# Patient Record
Sex: Female | Born: 1965 | Race: Black or African American | Hispanic: No | Marital: Married | Smoking: Former smoker
Health system: Southern US, Community
[De-identification: ages and names within clinical notes are randomized; demographics above are authoritative.]

## PROBLEM LIST (undated history)

## (undated) DIAGNOSIS — Z86010 Personal history of colonic polyps: Secondary | ICD-10-CM

## (undated) DIAGNOSIS — I10 Essential (primary) hypertension: Secondary | ICD-10-CM

## (undated) DIAGNOSIS — J309 Allergic rhinitis, unspecified: Secondary | ICD-10-CM

## (undated) DIAGNOSIS — M94261 Chondromalacia, right knee: Secondary | ICD-10-CM

## (undated) DIAGNOSIS — F321 Major depressive disorder, single episode, moderate: Secondary | ICD-10-CM

## (undated) DIAGNOSIS — K8020 Calculus of gallbladder without cholecystitis without obstruction: Secondary | ICD-10-CM

## (undated) DIAGNOSIS — F419 Anxiety disorder, unspecified: Secondary | ICD-10-CM

## (undated) HISTORY — DX: Allergic rhinitis, unspecified: J30.9

## (undated) HISTORY — DX: Chondromalacia, right knee: M94.261

## (undated) HISTORY — DX: Personal history of colonic polyps: Z86.010

## (undated) HISTORY — DX: Major depressive disorder, single episode, moderate: F32.1

## (undated) HISTORY — DX: Calculus of gallbladder without cholecystitis without obstruction: K80.20

---

## 2010-05-22 ENCOUNTER — Emergency Department (HOSPITAL_BASED_OUTPATIENT_CLINIC_OR_DEPARTMENT_OTHER): Admission: EM | Admit: 2010-05-22 | Discharge: 2010-05-22 | Payer: Self-pay | Admitting: Emergency Medicine

## 2010-08-17 DIAGNOSIS — J309 Allergic rhinitis, unspecified: Secondary | ICD-10-CM | POA: Insufficient documentation

## 2010-08-17 DIAGNOSIS — E669 Obesity, unspecified: Secondary | ICD-10-CM | POA: Insufficient documentation

## 2010-08-17 HISTORY — DX: Allergic rhinitis, unspecified: J30.9

## 2010-11-18 ENCOUNTER — Inpatient Hospital Stay (HOSPITAL_COMMUNITY)
Admission: EM | Admit: 2010-11-18 | Discharge: 2010-11-21 | Payer: Self-pay | Source: Home / Self Care | Admitting: Emergency Medicine

## 2010-11-18 ENCOUNTER — Ambulatory Visit: Payer: Self-pay | Admitting: Infectious Diseases

## 2010-12-05 ENCOUNTER — Encounter
Admission: RE | Admit: 2010-12-05 | Discharge: 2010-12-05 | Payer: Self-pay | Source: Home / Self Care | Attending: Family Medicine | Admitting: Family Medicine

## 2011-03-06 LAB — BASIC METABOLIC PANEL
BUN: 2 mg/dL — ABNORMAL LOW (ref 6–23)
BUN: 3 mg/dL — ABNORMAL LOW (ref 6–23)
BUN: 5 mg/dL — ABNORMAL LOW (ref 6–23)
CO2: 24 mEq/L (ref 19–32)
CO2: 26 mEq/L (ref 19–32)
CO2: 27 mEq/L (ref 19–32)
Calcium: 8.4 mg/dL (ref 8.4–10.5)
Calcium: 8.5 mg/dL (ref 8.4–10.5)
Calcium: 8.9 mg/dL (ref 8.4–10.5)
Chloride: 105 mEq/L (ref 96–112)
Chloride: 106 mEq/L (ref 96–112)
Chloride: 106 mEq/L (ref 96–112)
Creatinine, Ser: 0.71 mg/dL (ref 0.4–1.2)
Creatinine, Ser: 0.79 mg/dL (ref 0.4–1.2)
Creatinine, Ser: 0.94 mg/dL (ref 0.4–1.2)
GFR calc Af Amer: >60 mL/min (ref >60)
GFR calc Af Amer: >60 mL/min (ref >60)
GFR calc Af Amer: >60 mL/min (ref >60)
GFR calc non Af Amer: >60 mL/min (ref >60)
GFR calc non Af Amer: >60 mL/min (ref >60)
GFR calc non Af Amer: >60 mL/min (ref >60)
Glucose, Bld: 103 mg/dL — ABNORMAL HIGH (ref 70–99)
Glucose, Bld: 104 mg/dL — ABNORMAL HIGH (ref 70–99)
Glucose, Bld: 97 mg/dL (ref 70–99)
Potassium: 3.8 mEq/L (ref 3.5–5.1)
Potassium: 4 mEq/L (ref 3.5–5.1)
Potassium: 4.1 mEq/L (ref 3.5–5.1)
Sodium: 137 mEq/L (ref 135–145)
Sodium: 138 mEq/L (ref 135–145)
Sodium: 140 mEq/L (ref 135–145)

## 2011-03-06 LAB — HEMOGLOBIN A1C
Hgb A1c MFr Bld: 5.8 % — ABNORMAL HIGH (ref <5.7)
Mean Plasma Glucose: 120 mg/dL — ABNORMAL HIGH (ref <117)

## 2011-03-06 LAB — CBC
HCT: 31.6 % — ABNORMAL LOW (ref 36.0–46.0)
HCT: 32.4 % — ABNORMAL LOW (ref 36.0–46.0)
HCT: 33 % — ABNORMAL LOW (ref 36.0–46.0)
HCT: 40.8 % (ref 36.0–46.0)
Hemoglobin: 10.8 g/dL — ABNORMAL LOW (ref 12.0–15.0)
Hemoglobin: 11 g/dL — ABNORMAL LOW (ref 12.0–15.0)
Hemoglobin: 11.1 g/dL — ABNORMAL LOW (ref 12.0–15.0)
Hemoglobin: 14.1 g/dL (ref 12.0–15.0)
MCH: 29.4 pg (ref 26.0–34.0)
MCH: 30.7 pg (ref 26.0–34.0)
MCH: 30.9 pg (ref 26.0–34.0)
MCH: 31.1 pg (ref 26.0–34.0)
MCHC: 33.3 g/dL (ref 30.0–36.0)
MCHC: 34.2 g/dL (ref 30.0–36.0)
MCHC: 34.3 g/dL (ref 30.0–36.0)
MCHC: 34.5 g/dL (ref 30.0–36.0)
MCV: 88.2 fL (ref 78.0–100.0)
MCV: 89.8 fL (ref 78.0–100.0)
MCV: 89.9 fL (ref 78.0–100.0)
MCV: 90 fL (ref 78.0–100.0)
Platelets: 184 10*3/uL (ref 150–400)
Platelets: 190 10*3/uL (ref 150–400)
Platelets: 197 10*3/uL (ref 150–400)
Platelets: ADEQUATE 10*3/uL (ref 150–400)
RBC: 3.51 MIL/uL — ABNORMAL LOW (ref 3.87–5.11)
RBC: 3.61 MIL/uL — ABNORMAL LOW (ref 3.87–5.11)
RBC: 3.74 MIL/uL — ABNORMAL LOW (ref 3.87–5.11)
RBC: 4.53 MIL/uL (ref 3.87–5.11)
RDW: 12.5 % (ref 11.5–15.5)
RDW: 12.6 % (ref 11.5–15.5)
RDW: 12.9 % (ref 11.5–15.5)
RDW: 13.2 % (ref 11.5–15.5)
WBC: 12.7 10*3/uL — ABNORMAL HIGH (ref 4.0–10.5)
WBC: 5.4 10*3/uL (ref 4.0–10.5)
WBC: 5.7 10*3/uL (ref 4.0–10.5)
WBC: 7.7 10*3/uL (ref 4.0–10.5)

## 2011-03-06 LAB — POCT I-STAT, CHEM 8
BUN: <3 mg/dL — ABNORMAL LOW (ref 6–23)
Calcium, Ion: 1.12 mmol/L (ref 1.12–1.32)
Chloride: 103 mEq/L (ref 96–112)
Creatinine, Ser: 1 mg/dL (ref 0.4–1.2)
Glucose, Bld: 101 mg/dL — ABNORMAL HIGH (ref 70–99)
HCT: 45 % (ref 36.0–46.0)
Hemoglobin: 15.3 g/dL — ABNORMAL HIGH (ref 12.0–15.0)
Potassium: 4.8 mEq/L (ref 3.5–5.1)
Sodium: 138 mEq/L (ref 135–145)
TCO2: 28 mmol/L (ref 0–100)

## 2011-03-06 LAB — CULTURE, BLOOD (ROUTINE X 2)
Culture  Setup Time: 201111280009
Culture  Setup Time: 201111280009
Culture: NO GROWTH
Culture: NO GROWTH

## 2011-03-06 LAB — DIFFERENTIAL
Basophils Absolute: 0 10*3/uL (ref 0.0–0.1)
Basophils Relative: 0 % (ref 0–1)
Eosinophils Absolute: 0 10*3/uL (ref 0.0–0.7)
Eosinophils Relative: 0 % (ref 0–5)
Lymphocytes Relative: 22 % (ref 12–46)
Lymphs Abs: 2.8 10*3/uL (ref 0.7–4.0)
Monocytes Absolute: 0.8 10*3/uL (ref 0.1–1.0)
Monocytes Relative: 6 % (ref 3–12)
Neutro Abs: 9.1 10*3/uL — ABNORMAL HIGH (ref 1.7–7.7)
Neutrophils Relative %: 72 % (ref 43–77)

## 2011-03-06 LAB — HIV-1 RNA ULTRAQUANT REFLEX TO GENTYP+
HIV 1 RNA Quant: <20 copies/mL (ref <20)
HIV-1 RNA Quant, Log: <1.3 {Log} (ref <1.30)

## 2012-02-13 DIAGNOSIS — L659 Nonscarring hair loss, unspecified: Secondary | ICD-10-CM | POA: Insufficient documentation

## 2012-02-14 DIAGNOSIS — L089 Local infection of the skin and subcutaneous tissue, unspecified: Secondary | ICD-10-CM | POA: Insufficient documentation

## 2013-08-18 ENCOUNTER — Emergency Department (HOSPITAL_COMMUNITY): Payer: Self-pay

## 2013-08-18 ENCOUNTER — Emergency Department (HOSPITAL_COMMUNITY)
Admission: EM | Admit: 2013-08-18 | Discharge: 2013-08-18 | Disposition: A | Discharge status: Home / Self Care | Payer: Self-pay | Attending: Emergency Medicine | Admitting: Emergency Medicine

## 2013-08-18 ENCOUNTER — Encounter (HOSPITAL_COMMUNITY): Payer: Self-pay | Admitting: Emergency Medicine

## 2013-08-18 DIAGNOSIS — R51 Headache: Secondary | ICD-10-CM | POA: Insufficient documentation

## 2013-08-18 DIAGNOSIS — R000 Tachycardia, unspecified: Secondary | ICD-10-CM | POA: Insufficient documentation

## 2013-08-18 DIAGNOSIS — R05 Cough: Secondary | ICD-10-CM

## 2013-08-18 DIAGNOSIS — R519 Headache, unspecified: Secondary | ICD-10-CM

## 2013-08-18 DIAGNOSIS — R071 Chest pain on breathing: Secondary | ICD-10-CM | POA: Insufficient documentation

## 2013-08-18 DIAGNOSIS — R509 Fever, unspecified: Secondary | ICD-10-CM | POA: Insufficient documentation

## 2013-08-18 DIAGNOSIS — R0602 Shortness of breath: Secondary | ICD-10-CM | POA: Insufficient documentation

## 2013-08-18 DIAGNOSIS — R059 Cough, unspecified: Secondary | ICD-10-CM | POA: Insufficient documentation

## 2013-08-18 DIAGNOSIS — M791 Myalgia, unspecified site: Secondary | ICD-10-CM

## 2013-08-18 DIAGNOSIS — IMO0001 Reserved for inherently not codable concepts without codable children: Secondary | ICD-10-CM | POA: Insufficient documentation

## 2013-08-18 LAB — CBC WITH DIFFERENTIAL/PLATELET
Basophils Absolute: 0 10*3/uL (ref 0.0–0.1)
Basophils Relative: 0 % (ref 0–1)
Eosinophils Absolute: 0 10*3/uL (ref 0.0–0.7)
Eosinophils Relative: 0 % (ref 0–5)
HCT: 40.5 % (ref 36.0–46.0)
Hemoglobin: 13.5 g/dL (ref 12.0–15.0)
Lymphocytes Relative: 13 % (ref 12–46)
Lymphs Abs: 1.1 10*3/uL (ref 0.7–4.0)
MCH: 29.7 pg (ref 26.0–34.0)
MCHC: 33.3 g/dL (ref 30.0–36.0)
MCV: 89 fL (ref 78.0–100.0)
Monocytes Absolute: 0.6 10*3/uL (ref 0.1–1.0)
Monocytes Relative: 7 % (ref 3–12)
Neutro Abs: 7 10*3/uL (ref 1.7–7.7)
Neutrophils Relative %: 80 % — ABNORMAL HIGH (ref 43–77)
Platelets: 172 10*3/uL (ref 150–400)
RBC: 4.55 MIL/uL (ref 3.87–5.11)
RDW: 12.4 % (ref 11.5–15.5)
WBC: 8.7 10*3/uL (ref 4.0–10.5)

## 2013-08-18 LAB — URINALYSIS, ROUTINE W REFLEX MICROSCOPIC
Bilirubin Urine: NEGATIVE
Glucose, UA: NEGATIVE mg/dL
Hgb urine dipstick: NEGATIVE
Ketones, ur: NEGATIVE mg/dL
Leukocytes, UA: NEGATIVE
Nitrite: NEGATIVE
Protein, ur: NEGATIVE mg/dL
Specific Gravity, Urine: 1.024 (ref 1.005–1.030)
Urobilinogen, UA: 1 mg/dL (ref 0.0–1.0)
pH: 8 (ref 5.0–8.0)

## 2013-08-18 LAB — BASIC METABOLIC PANEL
BUN: 5 mg/dL — ABNORMAL LOW (ref 6–23)
CO2: 26 mEq/L (ref 19–32)
Calcium: 9.9 mg/dL (ref 8.4–10.5)
Chloride: 98 mEq/L (ref 96–112)
Creatinine, Ser: 0.8 mg/dL (ref 0.50–1.10)
GFR calc Af Amer: >90 mL/min (ref >90)
GFR calc non Af Amer: 86 mL/min — ABNORMAL LOW (ref >90)
Glucose, Bld: 120 mg/dL — ABNORMAL HIGH (ref 70–99)
Potassium: 3.8 mEq/L (ref 3.5–5.1)
Sodium: 134 mEq/L — ABNORMAL LOW (ref 135–145)

## 2013-08-18 LAB — CG4 I-STAT (LACTIC ACID): Lactic Acid, Venous: 0.64 mmol/L (ref 0.5–2.2)

## 2013-08-18 LAB — ACETAMINOPHEN LEVEL: Acetaminophen (Tylenol), Serum: <15 ug/mL (ref 10–30)

## 2013-08-18 MED ORDER — ACETAMINOPHEN 325 MG PO TABS: 650.0000 mg | ORAL_TABLET | Freq: Once | ORAL | Status: DC

## 2013-08-18 MED ORDER — MORPHINE SULFATE 4 MG/ML IJ SOLN
4.0000 mg | Freq: Once | INTRAMUSCULAR | Status: AC
  Administered 2013-08-18: 4 mg via INTRAVENOUS
  Filled 2013-08-18: qty 1

## 2013-08-18 MED ORDER — OXYCODONE-ACETAMINOPHEN 5-325 MG PO TABS: ORAL_TABLET | ORAL | Status: DC

## 2013-08-18 MED ORDER — SODIUM CHLORIDE 0.9 % IV BOLUS (SEPSIS)
1000.0000 mL | Freq: Once | INTRAVENOUS | Status: AC
  Administered 2013-08-18: 1000 mL via INTRAVENOUS

## 2013-08-18 MED ORDER — DOXYCYCLINE HYCLATE 100 MG PO TABS
100.0000 mg | ORAL_TABLET | Freq: Once | ORAL | Status: AC
  Administered 2013-08-18: 100 mg via ORAL
  Filled 2013-08-18: qty 1

## 2013-08-18 MED ORDER — DOXYCYCLINE HYCLATE 100 MG PO CAPS: 100.0000 mg | ORAL_CAPSULE | Freq: Two times a day (BID) | ORAL | Status: DC

## 2013-08-18 MED ORDER — IBUPROFEN 800 MG PO TABS
800.0000 mg | ORAL_TABLET | Freq: Once | ORAL | Status: AC
  Administered 2013-08-18: 800 mg via ORAL
  Filled 2013-08-18: qty 1

## 2013-08-18 MED ORDER — ONDANSETRON HCL 4 MG/2ML IJ SOLN
4.0000 mg | Freq: Once | INTRAMUSCULAR | Status: AC
  Administered 2013-08-18: 4 mg via INTRAVENOUS
  Filled 2013-08-18: qty 2

## 2013-08-18 NOTE — ED Notes (Signed)
Pt states has had fever ranging from 102 to 103.9 for the past 3 days, unrelieved by medication. States "head feels like it's about to blow, and my body hurts all over. Nausea and diarrhea, no vomiting. Unable to eat anything x 2 days.

## 2013-08-18 NOTE — ED Provider Notes (Signed)
CSN: [id number]     Arrival date & time 08/18/13  1948 History   First MD Initiated Contact with Patient 08/18/13 2001     Chief Complaint  Patient presents with  . Fever   (Consider location/radiation/quality/duration/timing/severity/associated sxs/prior Treatment) HPI  Deborah Stevens is a 47 y.o. female is otherwise healthy complaining of fever up to 103.9 starting 3 days ago. Patient has been taking 1000 mg of acetaminophen every 4 hours and cannot defervesce. Associated symptoms at onset were myalgia and headache. Patient also developed a productive cough 2 days ago. She endorses a shortness of breath and pleuritic chest pain. Patient denies abdominal pain, nausea vomiting, recent travel, rash, and she endorses a mild cervicalgia and has a sick contact: Her son had pneumonia 2 weeks ago.   History reviewed. No pertinent past medical history. History reviewed. No pertinent past surgical history. History reviewed. No pertinent family history. History  Substance Use Topics  . Smoking status: Never Smoker   . Smokeless tobacco: Not on file  . Alcohol Use: Yes     Comment: occasional   OB History   Grav Para Term Preterm Abortions TAB SAB Ect Mult Living                 Review of Systems  10 systems reviewed and found to be negative, except as noted in the HPI   Allergies  Review of patient's allergies indicates no known allergies.  Home Medications  No current outpatient prescriptions on file. BP 174/104  Pulse 137  Temp(Src) 103.3 F (39.6 C) (Oral)  Resp 18  SpO2 97%  LMP 08/04/2013 Physical Exam  Nursing note and vitals reviewed. Constitutional: She is oriented to person, place, and time. She appears well-developed and well-nourished. No distress.  HENT:  Head: Normocephalic.  Eyes: Conjunctivae and EOM are normal.  Cardiovascular: Normal rate.   Pulmonary/Chest: Effort normal. No stridor.  Musculoskeletal: Normal range of motion.  Neurological: She is alert and  oriented to person, place, and time.  Psychiatric: She has a normal mood and affect.    ED Course  Procedures (including critical care time) Labs Review Labs Reviewed  URINALYSIS, ROUTINE W REFLEX MICROSCOPIC - Abnormal; Notable for the following:    APPearance CLOUDY (*)    All other components within normal limits  CBC WITH DIFFERENTIAL - Abnormal; Notable for the following:    Neutrophils Relative % 80 (*)    All other components within normal limits  BASIC METABOLIC PANEL - Abnormal; Notable for the following:    Sodium 134 (*)    Glucose, Bld 120 (*)    BUN 5 (*)    GFR calc non Af Amer 86 (*)    All other components within normal limits  CULTURE, BLOOD (ROUTINE X 2)  CULTURE, BLOOD (ROUTINE X 2)  URINE CULTURE  ACETAMINOPHEN LEVEL  CG4 I-STAT (LACTIC ACID)   Imaging Review Dg Chest 2 View  08/18/2013   *RADIOLOGY REPORT*  Clinical Data: Shortness of breath, fever, cough, nausea and vomiting.  CHEST - 2 VIEW  Comparison: None.  Findings: There is suggestion of mild and diffuse bronchial thickening in both lungs which may be consistent with acute bronchitis.  No pulmonary consolidation, edema or pleural fluid is identified.  The heart size and mediastinal contours are within normal limits.  Bony thorax is unremarkable.  IMPRESSION: Suggestion of bronchial thickening which may be consistent with acute bronchitis.   Original Report Authenticated By: Irish Lack, M.D.    MDM  1. Fever   2. Cough   3. Tachycardia   4. Myalgia   5. Headache     Filed Vitals:   08/18/13 1956  BP: 174/104  Pulse: 137  Temp: 103.3 F (39.6 C)  TempSrc: Oral  Resp: 18  SpO2: 97%     Deborah Stevens is a 47 y.o. female is otherwise healthy complaining of fever for 3 days associated with myalgia, productive cough and headache. I doubt meningitis as neck exam is reassuring with no meningeal signs. Urinalysis shows no signs of infection, patient has no white count and no left shift. She is  not dehydrated clinically. Acetaminophen level ordered as patient has been taking 1000 mg of Tylenol every 4 hours to control the fever with out relief. However are Tylenol level is undetectable. Patient does meet serve criteria because of her fever and pulse however lactic acid is negative. Chest x-ray does not reveal an infiltrate however I feel this is the most likely cause of her fever. I will cover her for a community acquired pneumonia and also a [facility] spotted fever with doxycycline. Blood cultures and urine cultures are ordered.   This is a shared visit with the attending physician who personally evaluated the patient and agrees with the care plan.    Medications  sodium chloride 0.9 % bolus 1,000 mL (not administered)  ondansetron (ZOFRAN) injection 4 mg (not administered)    Pt is hemodynamically stable, appropriate for, and amenable to discharge at this time. Pt verbalized understanding and agrees with care plan. All questions answered. Outpatient follow-up and specific return precautions discussed.    Discharge Medication List as of 08/18/2013 11:10 PM    START taking these medications   Details  doxycycline (VIBRAMYCIN) 100 MG capsule Take 1 capsule (100 mg total) by mouth 2 (two) times daily., Starting 08/18/2013, Until Discontinued, Print    oxyCODONE-acetaminophen (PERCOCET/ROXICET) 5-325 MG per tablet 1 to 2 tabs PO q6hrs  PRN for pain, Print        Note: Portions of this report may have been transcribed using voice recognition software. Every effort was made to ensure accuracy; however, inadvertent computerized transcription errors may be present      Wynetta Emery, PA-C 08/19/13 0107

## 2013-08-19 ENCOUNTER — Emergency Department (HOSPITAL_COMMUNITY)
Admission: EM | Admit: 2013-08-19 | Discharge: 2013-08-19 | Disposition: A | Discharge status: Home / Self Care | Payer: Self-pay | Attending: Emergency Medicine | Admitting: Emergency Medicine

## 2013-08-19 ENCOUNTER — Encounter (HOSPITAL_COMMUNITY): Payer: Self-pay

## 2013-08-19 DIAGNOSIS — IMO0001 Reserved for inherently not codable concepts without codable children: Secondary | ICD-10-CM | POA: Insufficient documentation

## 2013-08-19 DIAGNOSIS — R52 Pain, unspecified: Secondary | ICD-10-CM | POA: Insufficient documentation

## 2013-08-19 DIAGNOSIS — J40 Bronchitis, not specified as acute or chronic: Secondary | ICD-10-CM | POA: Insufficient documentation

## 2013-08-19 DIAGNOSIS — R509 Fever, unspecified: Secondary | ICD-10-CM | POA: Insufficient documentation

## 2013-08-19 DIAGNOSIS — Z79899 Other long term (current) drug therapy: Secondary | ICD-10-CM | POA: Insufficient documentation

## 2013-08-19 LAB — CBC WITH DIFFERENTIAL/PLATELET
Basophils Absolute: 0 10*3/uL (ref 0.0–0.1)
Basophils Relative: 0 % (ref 0–1)
Eosinophils Absolute: 0 10*3/uL (ref 0.0–0.7)
Eosinophils Relative: 0 % (ref 0–5)
HCT: 37.7 % (ref 36.0–46.0)
Hemoglobin: 12.5 g/dL (ref 12.0–15.0)
Lymphocytes Relative: 17 % (ref 12–46)
Lymphs Abs: 1.3 10*3/uL (ref 0.7–4.0)
MCH: 29.8 pg (ref 26.0–34.0)
MCHC: 33.2 g/dL (ref 30.0–36.0)
MCV: 89.8 fL (ref 78.0–100.0)
Monocytes Absolute: 0.5 10*3/uL (ref 0.1–1.0)
Monocytes Relative: 7 % (ref 3–12)
Neutro Abs: 5.7 10*3/uL (ref 1.7–7.7)
Neutrophils Relative %: 76 % (ref 43–77)
Platelets: 165 10*3/uL (ref 150–400)
RBC: 4.2 MIL/uL (ref 3.87–5.11)
RDW: 12.6 % (ref 11.5–15.5)
WBC: 7.6 10*3/uL (ref 4.0–10.5)

## 2013-08-19 MED ORDER — ONDANSETRON HCL 4 MG/2ML IJ SOLN
4.0000 mg | Freq: Once | INTRAMUSCULAR | Status: AC
  Administered 2013-08-19: 4 mg via INTRAVENOUS
  Filled 2013-08-19: qty 2

## 2013-08-19 MED ORDER — HYDROMORPHONE HCL PF 1 MG/ML IJ SOLN
0.5000 mg | Freq: Once | INTRAMUSCULAR | Status: AC
  Administered 2013-08-19: 0.5 mg via INTRAVENOUS
  Filled 2013-08-19: qty 1

## 2013-08-19 MED ORDER — KETOROLAC TROMETHAMINE 30 MG/ML IJ SOLN
30.0000 mg | Freq: Once | INTRAMUSCULAR | Status: AC
  Administered 2013-08-19: 30 mg via INTRAVENOUS
  Filled 2013-08-19: qty 1

## 2013-08-19 MED ORDER — SODIUM CHLORIDE 0.9 % IV BOLUS (SEPSIS)
500.0000 mL | Freq: Once | INTRAVENOUS | Status: AC
  Administered 2013-08-19: 500 mL via INTRAVENOUS

## 2013-08-19 MED ORDER — ACETAMINOPHEN 325 MG PO TABS
ORAL_TABLET | ORAL | Status: AC
  Administered 2013-08-19: 650 mg
  Filled 2013-08-19: qty 2

## 2013-08-19 NOTE — ED Notes (Signed)
Pt states she was on her way to get her rx filled and she had near syncope episode. Pt was told to come back to ED if her fever came back. States she is starting to develop headache

## 2013-08-19 NOTE — Discharge Instructions (Signed)
Fever  °Fever is a higher-than-normal body temperature. A normal temperature varies with: °· Age. °· How it is measured (mouth, underarm, rectal, or ear). °· Time of day. °In an adult, an oral temperature around 98.6° Fahrenheit (F) or 37° Celsius (C) is considered normal. A rise in temperature of about 1.8° F or 1° C is generally considered a fever (100.4° F or 38° C). In an infant age 47 days or less, a rectal temperature of 100.4° F (38° C) generally is regarded as fever. Fever is not a disease but can be a symptom of illness. °CAUSES  °· Fever is most commonly caused by infection. °· Some non-infectious problems can cause fever. For example: °· Some arthritis problems. °· Problems with the thyroid or adrenal glands. °· Immune system problems. °· Some kinds of cancer. °· A reaction to certain medicines. °· Occasionally, the source of a fever cannot be determined. This is sometimes called a "Fever of Unknown Origin" (FUO). °· Some situations may lead to a temporary rise in body temperature that may go away on its own. Examples are: °· Childbirth. °· Surgery. °· Some situations may cause a rise in body temperature but these are not considered "true fever". Examples are: °· Intense exercise. °· Dehydration. °· Exposure to high outside or room temperatures. °SYMPTOMS  °· Feeling warm or hot. °· Fatigue or feeling exhausted. °· Aching all over. °· Chills. °· Shivering. °· Sweats. °DIAGNOSIS  °A fever can be suspected by your caregiver feeling that your skin is unusually warm. The fever is confirmed by taking a temperature with a thermometer. Temperatures can be taken different ways. Some methods are accurate and some are not: °With adults, adolescents, and children:  °· An oral temperature is used most commonly. °· An ear thermometer will only be accurate if it is positioned as recommended by the manufacturer. °· Under the arm temperatures are not accurate and not recommended. °· Most electronic thermometers are fast  and accurate. °Infants and Toddlers: °· Rectal temperatures are recommended and most accurate. °· Ear temperatures are not accurate in this age group and are not recommended. °· Skin thermometers are not accurate. °RISKS AND COMPLICATIONS  °· During a fever, the body uses more oxygen, so a person with a fever may develop rapid breathing or shortness of breath. This can be dangerous especially in people with heart or lung disease. °· The sweats that occur following a fever can cause dehydration. °· High fever can cause seizures in infants and children. °· Older persons can develop confusion during a fever. °TREATMENT  °· Medications may be used to control temperature. °· Do not give aspirin to children with fevers. There is an association with Reye's syndrome. Reye's syndrome is a rare but potentially deadly disease. °· If an infection is present and medications have been prescribed, take them as directed. Finish the full course of medications until they are gone. °· Sponging or bathing with room-temperature water may help reduce body temperature. Do not use ice water or alcohol sponge baths. °· Do not over-bundle children in blankets or heavy clothes. °· Drinking adequate fluids during an illness with fever is important to prevent dehydration. °HOME CARE INSTRUCTIONS  °· For adults, rest and adequate fluid intake are important. Dress according to how you feel, but do not over-bundle. °· Drink enough water and/or fluids to keep your urine clear or pale yellow. °· For infants over 3 months and children, giving medication as directed by your caregiver to control fever can   help with comfort. The amount to be given is based on the child's weight. Do NOT give more than is recommended. SEEK MEDICAL CARE IF:   You or your child are unable to keep fluids down.  Vomiting or diarrhea develops.  You develop a skin rash.  An oral temperature above 102 F (38.9 C) develops, or a fever which persists for over 3  days.  You develop excessive weakness, dizziness, fainting or extreme thirst.  Fevers keep coming back after 3 days. SEEK IMMEDIATE MEDICAL CARE IF:   Shortness of breath or trouble breathing develops  You pass out.  You feel you are making little or no urine.  New pain develops that was not there before (such as in the head, neck, chest, back, or abdomen).  You cannot hold down fluids.  Vomiting and diarrhea persist for more than a day or two.  You develop a stiff neck and/or your eyes become sensitive to light.  An unexplained temperature above 102 F (38.9 C) develops. Document Released: 12/09/2005 Document Revised: 03/02/2012 Document Reviewed: 11/24/2008 [facility] Patient Information 2014 [place], [place].  Bronchitis Bronchitis is the body's way of reacting to injury and/or infection (inflammation) of the bronchi. Bronchi are the air tubes that extend from the windpipe into the lungs. If the inflammation becomes severe, it may cause shortness of breath. CAUSES  Inflammation may be caused by:  A virus.  Germs (bacteria).  Dust.  Allergens.  Pollutants and many other irritants. The cells lining the bronchial tree are covered with tiny hairs (cilia). These constantly beat upward, away from the lungs, toward the mouth. This keeps the lungs free of pollutants. When these cells become too irritated and are unable to do their job, mucus begins to develop. This causes the characteristic cough of bronchitis. The cough clears the lungs when the cilia are unable to do their job. Without either of these protective mechanisms, the mucus would settle in the lungs. Then you would develop pneumonia. Smoking is a common cause of bronchitis and can contribute to pneumonia. Stopping this habit is the single most important thing you can do to help yourself. TREATMENT   Your caregiver may prescribe an antibiotic if the cough is caused by bacteria. Also, medicines that open up your airways  make it easier to breathe. Your caregiver may also recommend or prescribe an expectorant. It will loosen the mucus to be coughed up. Only take over-the-counter or prescription medicines for pain, discomfort, or fever as directed by your caregiver.  Removing whatever causes the problem (smoking, for example) is critical to preventing the problem from getting worse.  Cough suppressants may be prescribed for relief of cough symptoms.  Inhaled medicines may be prescribed to help with symptoms now and to help prevent problems from returning.  For those with recurrent (chronic) bronchitis, there may be a need for steroid medicines. SEEK IMMEDIATE MEDICAL CARE IF:   During treatment, you develop more pus-like mucus (purulent sputum).  You have a fever.  Your baby is older than 3 months with a rectal temperature of 102 F (38.9 C) or higher.  Your baby is 67 months old or younger with a rectal temperature of 100.4 F (38 C) or higher.  You become progressively more ill.  You have increased difficulty breathing, wheezing, or shortness of breath. It is necessary to seek immediate medical care if you are elderly or sick from any other disease. MAKE SURE YOU:   Understand these instructions.  Will watch your condition.  Will get help right away if you are not doing well or get worse. Document Released: 12/09/2005 Document Revised: 03/02/2012 Document Reviewed: 10/18/2008 [facility] Patient Information 2014 [place], [place].

## 2013-08-19 NOTE — ED Notes (Signed)
Pt was seen here last night for the same and she continues to have a fever, pt complains of general body aches and a fever since Monday

## 2013-08-19 NOTE — ED Provider Notes (Signed)
CSN: [id number]     Arrival date & time 08/19/13  1604 History   First MD Initiated Contact with Patient 08/19/13 1640     Chief Complaint  Patient presents with  . Fever  . Generalized Body Aches   (Consider location/radiation/quality/duration/timing/severity/associated sxs/prior Treatment) HPI Comments: Costovertebral for evaluation of fever. Patient reports that she has been running a fever for several days. Patient was seen in ER yesterday. She had workup for this was prescribed antibiotics. Patient reports that she slept all day, workup by 3 and noted she had a fever. She was feeling generalized body aches, noted to have fever of 104. Patient felt very weak think she passed out. She is continuing to have pain all over her body similar to that yesterday. She has not had any rash. Of note, patient did not start the antibiotic she was prescribed yesterday.  Patient is a 47 y.o. female presenting with fever.  Fever Associated symptoms: myalgias     History reviewed. No pertinent past medical history. History reviewed. No pertinent past surgical history. History reviewed. No pertinent family history. History  Substance Use Topics  . Smoking status: Never Smoker   . Smokeless tobacco: Not on file  . Alcohol Use: Yes     Comment: occasional   OB History   Grav Para Term Preterm Abortions TAB SAB Ect Mult Living                 Review of Systems  Constitutional: Positive for fever.  Musculoskeletal: Positive for myalgias.  All other systems reviewed and are negative.    Allergies  Review of patient's allergies indicates no known allergies.  Home Medications   Current Outpatient Rx  Name  Route  Sig  Dispense  Refill  . ibuprofen (ADVIL,MOTRIN) 200 MG tablet   Oral   Take 200 mg by mouth every 6 (six) hours as needed for pain.         Marland Kitchen doxycycline (VIBRAMYCIN) 100 MG capsule   Oral   Take 1 capsule (100 mg total) by mouth 2 (two) times daily.   20 capsule   0   .  oxyCODONE-acetaminophen (PERCOCET/ROXICET) 5-325 MG per tablet      1 to 2 tabs PO q6hrs  PRN for pain   15 tablet   0    BP 128/95  Pulse 97  Temp(Src) 100.6 F (38.1 C) (Oral)  Resp 20  Ht 5\' 7"  (1.702 m)  Wt 197 lb (89.359 kg)  BMI 30.85 kg/m2  SpO2 97%  LMP 08/04/2013 Physical Exam  Constitutional: She is oriented to person, place, and time. She appears well-developed and well-nourished. No distress.  HENT:  Head: Normocephalic and atraumatic.  Right Ear: Hearing normal.  Left Ear: Hearing normal.  Nose: Nose normal.  Mouth/Throat: Oropharynx is clear and moist and mucous membranes are normal.  Eyes: Conjunctivae and EOM are normal. Pupils are equal, round, and reactive to light.  Neck: Normal range of motion. Neck supple.  Cardiovascular: Regular rhythm, S1 normal and S2 normal.  Exam reveals no gallop and no friction rub.   No murmur heard. Pulmonary/Chest: Effort normal and breath sounds normal. No respiratory distress. She exhibits no tenderness.  Abdominal: Soft. Normal appearance and bowel sounds are normal. There is no hepatosplenomegaly. There is no tenderness. There is no rebound, no guarding, no tenderness at McBurney's point and negative Murphy's sign. No hernia.  Musculoskeletal: Normal range of motion.  Neurological: She is alert and oriented to person, place,  and time. She has normal strength. No cranial nerve deficit or sensory deficit. Coordination normal. GCS eye subscore is 4. GCS verbal subscore is 5. GCS motor subscore is 6.  Skin: Skin is warm, dry and intact. No rash noted. No cyanosis.  Psychiatric: She has a normal mood and affect. Her speech is normal and behavior is normal. Thought content normal.    ED Course  Procedures (including critical care time) Labs Review Labs Reviewed  CULTURE, BLOOD (ROUTINE X 2)  CULTURE, BLOOD (ROUTINE X 2)  CBC WITH DIFFERENTIAL   Imaging Review Dg Chest 2 View  08/18/2013   *RADIOLOGY REPORT*  Clinical Data:  Shortness of breath, fever, cough, nausea and vomiting.  CHEST - 2 VIEW  Comparison: None.  Findings: There is suggestion of mild and diffuse bronchial thickening in both lungs which may be consistent with acute bronchitis.  No pulmonary consolidation, edema or pleural fluid is identified.  The heart size and mediastinal contours are within normal limits.  Bony thorax is unremarkable.  IMPRESSION: Suggestion of bronchial thickening which may be consistent with acute bronchitis.   Original Report Authenticated By: Irish Lack, M.D.    MDM  Diagnosis: Fever  The patient presents to the ER for evaluation of continued fever. Patient was seen yesterday and had workup performed. The cultures have not returned yet. Her urinalysis yesterday was unremarkable. Patient was initiated on doxycycline at discharge, but has not started the antibiotic. Patient returns today for continued fever and it was discussed with her that she had not initiated the treatment for the fever and therefore continuing her fever is not surprising. Repeat blood cultures were obtained today. Repeat CBC shows her white count has actually gone down since yesterday. Patient was counseled that she needs to start the doxycycline and was provided analgesia.   Gilda Crease, MD 08/19/13 2013

## 2013-08-19 NOTE — ED Notes (Signed)
Pt took ibuprofen at 345p at home when her temp was 103.6

## 2013-08-19 NOTE — Progress Notes (Signed)
EDCM spoke to patient regarding her pcp and medication assistance. Patient confirms she does not have insurance at this time.  Patient confirms her pcp is Dr. Maryelizabeth Rowan of [facility].  EDCM called [facility] on [address]. to price patient's medications.  doxycycline 100mg  20 tablets is $53. 97, and Percocet 5/325 for 15 tablets was $6.24.  Patient satisfied with these prices.  [facility] provided patient RX discount card as well.  [facility] provided patient list of pcps who accept self pay patients, information regarding Affordable Care Act and Medicaid for insurance, listr of discount pharmacies and website needymeds.org fro medication assistance and list of financial assistance in the community such as local churches and [employer].  Patient very thankful for resources.  No further needs at this time.

## 2013-08-20 LAB — URINE CULTURE
Colony Count: NO GROWTH
Culture: NO GROWTH

## 2013-08-24 NOTE — ED Provider Notes (Signed)
Shared service with midlevel provider. I have personally seen and examined the patient, providing direct face to face care, presenting with the chief complaint of fevers. Physical exam findings were unremarkable. Pt had fevers of unknown origin based on exam. Hx suggestive of URI. Plan will be to get blood cultures x 2, lactate is WNL - we will discharge patient with return precautions. I have reviewed the nursing documentation on past medical history, family history, and social history.   Derwood Kaplan, MD 08/24/13 1621

## 2013-08-25 LAB — CULTURE, BLOOD (ROUTINE X 2)
Culture: NO GROWTH
Culture: NO GROWTH
Culture: NO GROWTH
Culture: NO GROWTH

## 2014-03-26 ENCOUNTER — Emergency Department (HOSPITAL_COMMUNITY): Payer: BC Managed Care – PPO

## 2014-03-26 ENCOUNTER — Encounter (HOSPITAL_COMMUNITY): Payer: Self-pay | Admitting: Emergency Medicine

## 2014-03-26 ENCOUNTER — Emergency Department (HOSPITAL_COMMUNITY)
Admission: EM | Admit: 2014-03-26 | Discharge: 2014-03-26 | Disposition: A | Discharge status: Home / Self Care | Payer: BC Managed Care – PPO | Attending: Emergency Medicine | Admitting: Emergency Medicine

## 2014-03-26 DIAGNOSIS — K8050 Calculus of bile duct without cholangitis or cholecystitis without obstruction: Secondary | ICD-10-CM

## 2014-03-26 DIAGNOSIS — R0789 Other chest pain: Secondary | ICD-10-CM

## 2014-03-26 DIAGNOSIS — R0602 Shortness of breath: Secondary | ICD-10-CM | POA: Insufficient documentation

## 2014-03-26 DIAGNOSIS — K8020 Calculus of gallbladder without cholecystitis without obstruction: Secondary | ICD-10-CM | POA: Insufficient documentation

## 2014-03-26 LAB — CBC
HCT: 39.9 % (ref 36.0–46.0)
Hemoglobin: 13.4 g/dL (ref 12.0–15.0)
MCH: 30 pg (ref 26.0–34.0)
MCHC: 33.6 g/dL (ref 30.0–36.0)
MCV: 89.3 fL (ref 78.0–100.0)
Platelets: 183 10*3/uL (ref 150–400)
RBC: 4.47 MIL/uL (ref 3.87–5.11)
RDW: 12.7 % (ref 11.5–15.5)
WBC: 8 10*3/uL (ref 4.0–10.5)

## 2014-03-26 LAB — BASIC METABOLIC PANEL
BUN: 9 mg/dL (ref 6–23)
CO2: 24 mEq/L (ref 19–32)
Calcium: 9.5 mg/dL (ref 8.4–10.5)
Chloride: 101 mEq/L (ref 96–112)
Creatinine, Ser: 0.68 mg/dL (ref 0.50–1.10)
GFR calc Af Amer: >90 mL/min (ref >90)
GFR calc non Af Amer: >90 mL/min (ref >90)
Glucose, Bld: 126 mg/dL — ABNORMAL HIGH (ref 70–99)
Potassium: 3.7 mEq/L (ref 3.7–5.3)
Sodium: 139 mEq/L (ref 137–147)

## 2014-03-26 LAB — I-STAT TROPONIN, ED: Troponin i, poc: 0.01 ng/mL (ref 0.00–0.08)

## 2014-03-26 LAB — HEPATIC FUNCTION PANEL
ALT: 89 U/L — ABNORMAL HIGH (ref 0–35)
AST: 191 U/L — ABNORMAL HIGH (ref 0–37)
Albumin: 3.7 g/dL (ref 3.5–5.2)
Alkaline Phosphatase: 81 U/L (ref 39–117)
Bilirubin, Direct: <0.2 mg/dL (ref 0.0–0.3)
Total Bilirubin: 0.4 mg/dL (ref 0.3–1.2)
Total Protein: 7.7 g/dL (ref 6.0–8.3)

## 2014-03-26 LAB — TROPONIN I: Troponin I: <0.3 ng/mL (ref <0.30)

## 2014-03-26 LAB — LIPASE, BLOOD: Lipase: 37 U/L (ref 11–59)

## 2014-03-26 MED ORDER — ONDANSETRON 4 MG PO TBDP: ORAL_TABLET | ORAL | Status: DC

## 2014-03-26 MED ORDER — MORPHINE SULFATE 4 MG/ML IJ SOLN
4.0000 mg | Freq: Once | INTRAMUSCULAR | Status: AC
  Administered 2014-03-26: 4 mg via INTRAVENOUS
  Filled 2014-03-26: qty 1

## 2014-03-26 MED ORDER — OXYCODONE-ACETAMINOPHEN 5-325 MG PO TABS: 1.0000 | ORAL_TABLET | ORAL | Status: DC | PRN

## 2014-03-26 MED ORDER — GI COCKTAIL ~~LOC~~
30.0000 mL | Freq: Once | ORAL | Status: AC
  Administered 2014-03-26: 30 mL via ORAL
  Filled 2014-03-26: qty 30

## 2014-03-26 MED ORDER — ONDANSETRON HCL 4 MG/2ML IJ SOLN
4.0000 mg | Freq: Once | INTRAMUSCULAR | Status: AC
  Administered 2014-03-26: 4 mg via INTRAVENOUS
  Filled 2014-03-26: qty 2

## 2014-03-26 NOTE — ED Provider Notes (Signed)
CSN: [id number]     Arrival date & time 03/26/14  0211 History   First MD Initiated Contact with Patient 03/26/14 0258     Chief Complaint  Patient presents with  . Shortness of Breath  . Chest Pain     (Consider location/radiation/quality/duration/timing/severity/associated sxs/prior Treatment) HPI Patient presents with upper abdominal and lower chest pain starting this evening at midnight. She abdominal quarter pounder meal at 5:30 PM yesterday evening. She started having upper, pain shortly before bed. She had nausea associated with her pain and she woke at midnight. The pain radiated to her lower chest and upper back. She's had some mild shortness of breath associated with this. She denies any lower extremity swelling or pain. She has no history of any cardiac disease. She's had no recent immobilizations from surgery or extended travel. History reviewed. No pertinent past medical history. History reviewed. No pertinent past surgical history. No family history on file. History  Substance Use Topics  . Smoking status: Never Smoker   . Smokeless tobacco: Not on file  . Alcohol Use: Yes     Comment: occasional   OB History   Grav Para Term Preterm Abortions TAB SAB Ect Mult Living                 Review of Systems  Constitutional: Negative for fever and chills.  Respiratory: Positive for shortness of breath. Negative for cough and wheezing.   Cardiovascular: Positive for chest pain. Negative for palpitations and leg swelling.  Gastrointestinal: Positive for nausea, abdominal pain and diarrhea. Negative for vomiting.  Musculoskeletal: Negative for back pain, myalgias, neck pain and neck stiffness.  Skin: Negative for rash and wound.  Neurological: Negative for dizziness, weakness, light-headedness, numbness and headaches.  All other systems reviewed and are negative.      Allergies  Review of patient's allergies indicates no known allergies.  Home Medications  No current  outpatient prescriptions on file. BP 155/85  Pulse 93  Temp(Src) 98.4 F (36.9 C) (Oral)  Resp 18  SpO2 100%  LMP 03/16/2014 Physical Exam  Nursing note and vitals reviewed. Constitutional: She is oriented to person, place, and time. She appears well-developed and well-nourished. No distress.  HENT:  Head: Normocephalic and atraumatic.  Mouth/Throat: Oropharynx is clear and moist.  Eyes: EOM are normal. Pupils are equal, round, and reactive to light.  Neck: Normal range of motion. Neck supple.  Cardiovascular: Normal rate and regular rhythm.   Pulmonary/Chest: Effort normal and breath sounds normal. No respiratory distress. She has no wheezes. She has no rales. She exhibits tenderness (chest tenderness to palpation over the pectoralis. No crepitance or deformity.).  Abdominal: Soft. Bowel sounds are normal. She exhibits no distension and no mass. There is tenderness (Tenderness to palpation epigastrium and left upper quadrant.). There is no rebound and no guarding.  Musculoskeletal: Normal range of motion. She exhibits no edema and no tenderness.  No calf swelling or tenderness.  Neurological: She is alert and oriented to person, place, and time.  Moves all extremities without deficit. Sensation grossly intact.  Skin: Skin is warm and dry. No rash noted. No erythema.  Psychiatric: She has a normal mood and affect. Her behavior is normal.    ED Course  Procedures (including critical care time) Labs Review Labs Reviewed  BASIC METABOLIC PANEL - Abnormal; Notable for the following:    Glucose, Bld 126 (*)    All other components within normal limits  HEPATIC FUNCTION PANEL - Abnormal; Notable for  the following:    AST 191 (*)    ALT 89 (*)    All other components within normal limits  CBC  LIPASE, BLOOD  I-STAT TROPOININ, ED   Imaging Review Dg Chest 2 View  03/26/2014   CLINICAL DATA:  Shortness of breath, chest pain  EXAM: CHEST  2 VIEW  COMPARISON:  Prior radiograph from  08/18/2013  FINDINGS: The cardiac and mediastinal silhouettes are stable in size and contour, and remain within normal limits.  The lungs are normally inflated. No airspace consolidation, pleural effusion, or pulmonary edema is identified. There is no pneumothorax.  No acute osseous abnormality identified.  IMPRESSION: No acute cardiopulmonary abnormality.   Electronically Signed   By: Jeannine Boga M.D.   On: 03/26/2014 04:52   US Abdomen Complete  03/26/2014   CLINICAL DATA:  Upper abdominal pain.  EXAM: ULTRASOUND ABDOMEN COMPLETE  COMPARISON:  None.  FINDINGS: Gallbladder:  Numerous stones are seen filling the gallbladder, measuring up to 1.6 cm in size. Borderline gallbladder wall wall thickening is noted, without evidence for obstruction or cholecystitis. No sonographic Murphy sign noted.  Common bile duct:  Diameter: 0.3 cm, within normal limits in caliber.  Liver:  No focal lesion identified. Within normal limits in parenchymal echogenicity.  IVC:  No abnormality visualized.  Pancreas:  Visualized portion unremarkable.  Spleen:  Size and appearance within normal limits.  Right Kidney:  Length: 11.1 cm. Echogenicity within normal limits. No mass or hydronephrosis visualized.  Left Kidney:  Length: 11.1 cm. Echogenicity within normal limits. No mass or hydronephrosis visualized.  Abdominal aorta:  No aneurysm visualized.  Other findings:  None.  IMPRESSION: 1. Numerous stones seen filling the gallbladder, measuring up to 1.6 cm. Borderline prominence of the gallbladder wall, without definite evidence for obstruction or cholecystitis. 2. Otherwise unremarkable abdominal ultrasound.   Electronically Signed   By: Garald Balding M.D.   On: 03/26/2014 05:57     EKG Interpretation   Date/Time:  Saturday March 26 2014 02:25:15 EDT Ventricular Rate:  72 PR Interval:  233 QRS Duration: 76 QT Interval:  389 QTC Calculation: 426 R Axis:   60 Text Interpretation:  Sinus rhythm Prolonged PR interval  Confirmed by  Lita Mains  MD, Celica Kotowski ([id number]) on 03/26/2014 7:27:47 AM      MDM   Final diagnoses:  None   Discuss results with Dr. Hassell Done. Recommends discharge home and followup as outpatient with Surgery as outpatient. Patient's pain is resolved. Her vital signs are stable in the emergency department. Her chest pain is very atypical for coronary artery disease. Is reproduced with palpation of the left chest. Is also associated with biliary colic. I have a suspicion for coronary artery disease with a normal EKG and troponin x2 that are negative. Return precautions have been given the patient is voiced understanding.     Julianne Rice, MD 03/26/14 [phone]

## 2014-03-26 NOTE — ED Notes (Addendum)
Pt presents with c/o shortness of breath and chest pain that began around midnight tonight. Pt says her left arm is also tingling. Pt denies v/d but has had some nausea. Pt says the chest pain is in the center and left side of her chest. Pt says the chest pain is worse with breathing. Pt is talking in complete sentences, no acute resp distress at this time.

## 2014-03-26 NOTE — Discharge Instructions (Signed)
Biliary Colic  °Biliary colic is a steady or irregular pain in the upper abdomen. It is usually under the right side of the rib cage. It happens when gallstones interfere with the normal flow of bile from the gallbladder. Bile is a liquid that helps to digest fats. Bile is made in the liver and stored in the gallbladder. When you eat a meal, bile passes from the gallbladder through the cystic duct and the common bile duct into the small intestine. There, it mixes with partially digested food. If a gallstone blocks either of these ducts, the normal flow of bile is blocked. The muscle cells in the bile duct contract forcefully to try to move the stone. This causes the pain of biliary colic.  °SYMPTOMS  °· A person with biliary colic usually complains of pain in the upper abdomen. This pain can be: °· In the center of the upper abdomen just below the breastbone. °· In the upper-right part of the abdomen, near the gallbladder and liver. °· Spread back toward the right shoulder blade. °· Nausea and vomiting. °· The pain usually occurs after eating. °· Biliary colic is usually triggered by the digestive system's demand for bile. The demand for bile is high after fatty meals. Symptoms can also occur when a person who has been fasting suddenly eats a very large meal. Most episodes of biliary colic pass after 1 to 5 hours. After the most intense pain passes, your abdomen may continue to ache mildly for about 24 hours. °DIAGNOSIS  °After you describe your symptoms, your caregiver will perform a physical exam. He or she will pay attention to the upper right portion of your belly (abdomen). This is the area of your liver and gallbladder. An ultrasound will help your caregiver look for gallstones. Specialized scans of the gallbladder may also be done. Blood tests may be done, especially if you have fever or if your pain persists. °PREVENTION  °Biliary colic can be prevented by controlling the risk factors for gallstones. Some of  these risk factors, such as heredity, increasing age, and pregnancy are a normal part of life. Obesity and a high-fat diet are risk factors you can change through a healthy lifestyle. Women going through menopause who take hormone replacement therapy (estrogen) are also more likely to develop biliary colic. °TREATMENT  °· Pain medication may be prescribed. °· You may be encouraged to eat a fat-free diet. °· If the first episode of biliary colic is severe, or episodes of colic keep retuning, surgery to remove the gallbladder (cholecystectomy) is usually recommended. This procedure can be done through small incisions using an instrument called a laparoscope. The procedure often requires a brief stay in the hospital. Some people can leave the hospital the same day. It is the most widely used treatment in people troubled by painful gallstones. It is effective and safe, with no complications in more than 90% of cases. °· If surgery cannot be done, medication that dissolves gallstones may be used. This medication is expensive and can take months or years to work. Only small stones will dissolve. °· Rarely, medication to dissolve gallstones is combined with a procedure called shock-wave lithotripsy. This procedure uses carefully aimed shock waves to break up gallstones. In many people treated with this procedure, gallstones form again within a few years. °PROGNOSIS  °If gallstones block your cystic duct or common bile duct, you are at risk for repeated episodes of biliary colic. There is also a 25% chance that you will develop   a gallbladder infection(acute cholecystitis), or some other complication of gallstones within 10 to 20 years. If you have surgery, schedule it at a time that is convenient for you and at a time when you are not sick. HOME CARE INSTRUCTIONS   Drink plenty of clear fluids.  Avoid fatty, greasy or fried foods, or any foods that make your pain worse.  Take medications as directed. SEEK MEDICAL  CARE IF:   You develop a fever over 100.5 F (38.1 C).  Your pain gets worse over time.  You develop nausea that prevents you from eating and drinking.  You develop vomiting. SEEK IMMEDIATE MEDICAL CARE IF:   You have continuous or severe belly (abdominal) pain which is not relieved with medications.  You develop nausea and vomiting which is not relieved with medications.  You have symptoms of biliary colic and you suddenly develop a fever and shaking chills. This may signal cholecystitis. Call your caregiver immediately.  You develop a yellow color to your skin or the white part of your eyes (jaundice). Document Released: 05/12/2006 Document Revised: 03/02/2012 Document Reviewed: 07/21/2008 [facility] Patient Information 2014 [place].  Chest Pain (Nonspecific) It is often hard to give a specific diagnosis for the cause of chest pain. There is always a chance that your pain could be related to something serious, such as a heart attack or a blood clot in the lungs. You need to follow up with your caregiver for further evaluation. CAUSES   Heartburn.  Pneumonia or bronchitis.  Anxiety or stress.  Inflammation around your heart (pericarditis) or lung (pleuritis or pleurisy).  A blood clot in the lung.  A collapsed lung (pneumothorax). It can develop suddenly on its own (spontaneous pneumothorax) or from injury (trauma) to the chest.  Shingles infection (herpes zoster virus). The chest wall is composed of bones, muscles, and cartilage. Any of these can be the source of the pain.  The bones can be bruised by injury.  The muscles or cartilage can be strained by coughing or overwork.  The cartilage can be affected by inflammation and become sore (costochondritis). DIAGNOSIS  Lab tests or other studies, such as X-rays, electrocardiography, stress testing, or cardiac imaging, may be needed to find the cause of your pain.  TREATMENT   Treatment depends on what may be  causing your chest pain. Treatment may include:  Acid blockers for heartburn.  Anti-inflammatory medicine.  Pain medicine for inflammatory conditions.  Antibiotics if an infection is present.  You may be advised to change lifestyle habits. This includes stopping smoking and avoiding alcohol, caffeine, and chocolate.  You may be advised to keep your head raised (elevated) when sleeping. This reduces the chance of acid going backward from your stomach into your esophagus.  Most of the time, nonspecific chest pain will improve within 2 to 3 days with rest and mild pain medicine. HOME CARE INSTRUCTIONS   If antibiotics were prescribed, take your antibiotics as directed. Finish them even if you start to feel better.  For the next few days, avoid physical activities that bring on chest pain. Continue physical activities as directed.  Do not smoke.  Avoid drinking alcohol.  Only take over-the-counter or prescription medicine for pain, discomfort, or fever as directed by your caregiver.  Follow your caregiver's suggestions for further testing if your chest pain does not go away.  Keep any follow-up appointments you made. If you do not go to an appointment, you could develop lasting (chronic) problems with pain. If there  is any problem keeping an appointment, you must call to reschedule. SEEK MEDICAL CARE IF:   You think you are having problems from the medicine you are taking. Read your medicine instructions carefully.  Your chest pain does not go away, even after treatment.  You develop a rash with blisters on your chest. SEEK IMMEDIATE MEDICAL CARE IF:   You have increased chest pain or pain that spreads to your arm, neck, jaw, back, or abdomen.  You develop shortness of breath, an increasing cough, or you are coughing up blood.  You have severe back or abdominal pain, feel nauseous, or vomit.  You develop severe weakness, fainting, or chills.  You have a fever. THIS IS AN  EMERGENCY. Do not wait to see if the pain will go away. Get medical help at once. Call your local emergency services (911 in [place] Do not drive yourself to the hospital. MAKE SURE YOU:   Understand these instructions.  Will watch your condition.  Will get help right away if you are not doing well or get worse. Document Released: 09/18/2005 Document Revised: 03/02/2012 Document Reviewed: 07/14/2008 [facility] Patient Information 2014 [place].

## 2014-04-08 ENCOUNTER — Ambulatory Visit (INDEPENDENT_AMBULATORY_CARE_PROVIDER_SITE_OTHER): Payer: BC Managed Care – PPO | Admitting: General Surgery

## 2014-04-08 ENCOUNTER — Encounter (INDEPENDENT_AMBULATORY_CARE_PROVIDER_SITE_OTHER): Payer: Self-pay | Admitting: General Surgery

## 2014-04-08 VITALS — BP 122/86 | HR 78 | Temp 97.8°F | Resp 14 | Ht 68.0 in | Wt 195.0 lb

## 2014-04-08 DIAGNOSIS — K8020 Calculus of gallbladder without cholecystitis without obstruction: Secondary | ICD-10-CM

## 2014-04-08 HISTORY — DX: Calculus of gallbladder without cholecystitis without obstruction: K80.20

## 2014-04-08 NOTE — Progress Notes (Addendum)
Patient ID: Deborah Stevens, female   DOB: 02/19/1966, 48 y.o.   MRN: [id number]  Chief Complaint  Patient presents with  . New Evaluation    eval biliary colic    HPI Deborah Stevens is a 48 y.o. female.  Chief complaint: Right upper quadrant pain and gallstones HPI Patient developed an acute onset right quadrant abdominal pain. This was very severe. Was in her epigastric region and she was evaluated at the emergency department. She was found to have multiple gallstones without evidence of acute cholecystitis. She was discharged. Since that time, she has had several more mild attacks with epigastric pain. It has been affecting her at work. No change in bowel habits. She has not had any previous surgeries.I was asked to see her in consultation by Dr. Ernie Hew. History reviewed. No pertinent past medical history.  History reviewed. No pertinent past surgical history.  History reviewed. No pertinent family history.  Social History History  Substance Use Topics  . Smoking status: Former Smoker    Quit date: 04/08/2013  . Smokeless tobacco: Not on file  . Alcohol Use: Yes     Comment: occasional    No Known Allergies  No current outpatient prescriptions on file.   No current facility-administered medications for this visit.    Review of Systems Review of Systems  Constitutional: Negative for fever, chills and unexpected weight change.  HENT: Negative for congestion, hearing loss, sore throat, trouble swallowing and voice change.   Eyes: Negative for visual disturbance.  Respiratory: Negative for cough and wheezing.   Cardiovascular: Negative for chest pain, palpitations and leg swelling.  Gastrointestinal: Positive for nausea and abdominal pain. Negative for vomiting, diarrhea, constipation, blood in stool, abdominal distention and anal bleeding.  Genitourinary: Negative for hematuria, vaginal bleeding and difficulty urinating.  Musculoskeletal: Negative for arthralgias.  Skin:  Negative for rash and wound.  Neurological: Negative for seizures, syncope and headaches.  Hematological: Negative for adenopathy. Does not bruise/bleed easily.  Psychiatric/Behavioral: Negative for confusion.    Blood pressure 122/86, pulse 78, temperature 97.8 F (36.6 C), temperature source Temporal, resp. rate 14, height 5\' 8"  (1.727 m), weight 195 lb (88.451 kg), last menstrual period 03/16/2014.  Physical Exam Physical Exam  Constitutional: She is oriented to person, place, and time. She appears well-developed and well-nourished. No distress.  HENT:  Head: Normocephalic and atraumatic.  Eyes: EOM are normal. Pupils are equal, round, and reactive to light.  Neck: Normal range of motion. Neck supple. No tracheal deviation present.  Cardiovascular: Normal rate, regular rhythm and normal heart sounds.   Pulmonary/Chest: Effort normal and breath sounds normal. No stridor. No respiratory distress. She has no wheezes. She has no rales.  Abdominal: Soft. Bowel sounds are normal. She exhibits no distension. There is tenderness. There is no rebound and no guarding.  Minimal right upper quadrant tenderness to deep palpation  Musculoskeletal: Normal range of motion. She exhibits no tenderness.  Lymphadenopathy:    She has no cervical adenopathy.  Neurological: She is alert and oriented to person, place, and time.  Skin: Skin is warm and dry.  Psychiatric: She has a normal mood and affect.    Data Reviewed [place]  Assessment    Symptomatic cholelithiasis     Plan    I've offered laparoscopic cholecystectomy with cholangiogram. Procedure, risks, benefits were discussed in detail with the patient. I gave her some educational literature. She looks forward to scheduling in the future. We will plan outpatient.  Zenovia Jarred 04/08/2014, 11:42 AM

## 2014-04-25 ENCOUNTER — Encounter (HOSPITAL_COMMUNITY): Payer: Self-pay | Admitting: Pharmacy Technician

## 2014-04-26 ENCOUNTER — Encounter (HOSPITAL_COMMUNITY): Payer: Self-pay

## 2014-04-26 ENCOUNTER — Encounter (HOSPITAL_COMMUNITY)
Admission: RE | Admit: 2014-04-26 | Discharge: 2014-04-26 | Disposition: A | Discharge status: Home / Self Care | Payer: BC Managed Care – PPO | Source: Ambulatory Visit | Attending: General Surgery | Admitting: General Surgery

## 2014-04-26 LAB — CBC
HCT: 40.1 % (ref 36.0–46.0)
Hemoglobin: 13.6 g/dL (ref 12.0–15.0)
MCH: 30.3 pg (ref 26.0–34.0)
MCHC: 33.9 g/dL (ref 30.0–36.0)
MCV: 89.3 fL (ref 78.0–100.0)
Platelets: 207 10*3/uL (ref 150–400)
RBC: 4.49 MIL/uL (ref 3.87–5.11)
RDW: 12.7 % (ref 11.5–15.5)
WBC: 6.3 10*3/uL (ref 4.0–10.5)

## 2014-04-26 LAB — BASIC METABOLIC PANEL
BUN: 10 mg/dL (ref 6–23)
CO2: 25 mEq/L (ref 19–32)
Calcium: 9.4 mg/dL (ref 8.4–10.5)
Chloride: 103 mEq/L (ref 96–112)
Creatinine, Ser: 0.76 mg/dL (ref 0.50–1.10)
GFR calc Af Amer: >90 mL/min (ref >90)
GFR calc non Af Amer: >90 mL/min (ref >90)
Glucose, Bld: 93 mg/dL (ref 70–99)
Potassium: 4.2 mEq/L (ref 3.7–5.3)
Sodium: 140 mEq/L (ref 137–147)

## 2014-04-26 LAB — HCG, SERUM, QUALITATIVE: Preg, Serum: NEGATIVE

## 2014-04-26 NOTE — Progress Notes (Signed)
Primary physician - [facility]  No cardiac testing other than ekg in epic

## 2014-04-26 NOTE — Pre-Procedure Instructions (Signed)
Deborah Stevens  04/26/2014   Your procedure is scheduled on:  Friday, May 8th  Report to [facility] at 0530 AM.  Call this number if you have problems the morning of surgery: [phone]   Remember:   Do not eat food or drink liquids after midnight.   Take these medicines the morning of surgery with A SIP OF WATER: tylenol if needed, eye drops   Do not wear jewelry, make-up or nail polish.  Do not wear lotions, powders, or perfumes. You may wear deodorant.  Do not shave 48 hours prior to surgery. Men may shave face and neck.  Do not bring valuables to the hospital.  [facility] is not responsible for any belongings or valuables.               Contacts, dentures or bridgework may not be worn into surgery.  Leave suitcase in the car. After surgery it may be brought to your room.  For patients admitted to the hospital, discharge time is determined by your  treatment team.               Patients discharged the day of surgery will not be allowed to drive home.  Please read over the following fact sheets that you were given: Pain Booklet, Coughing and Deep Breathing and Surgical Site Infection Prevention [facility] - Preparing for Surgery  Before surgery, you can play an important role.  Because skin is not sterile, your skin needs to be as free of germs as possible.  You can reduce the number of germs on you skin by washing with CHG (chlorahexidine gluconate) soap before surgery.  CHG is an antiseptic cleaner which kills germs and bonds with the skin to continue killing germs even after washing.  Please DO NOT use if you have an allergy to CHG or antibacterial soaps.  If your skin becomes reddened/irritated stop using the CHG and inform your nurse when you arrive at Short Stay.  Do not shave (including legs and underarms) for at least 48 hours prior to the first CHG shower.  You may shave your face.  Please follow these instructions carefully:   1.  Shower with CHG  Soap the night before surgery and the morning of Surgery.  2.  If you choose to wash your hair, wash your hair first as usual with your normal shampoo.  3.  After you shampoo, rinse your hair and body thoroughly to remove the shampoo.  4.  Use CHG as you would any other liquid soap.  You can apply CHG directly to the skin and wash gently with scrungie or a clean washcloth.  5.  Apply the CHG Soap to your body ONLY FROM THE NECK DOWN.  Do not use on open wounds or open sores.  Avoid contact with your eyes, ears, mouth and genitals (private parts).  Wash genitals (private parts) with your normal soap.  6.  Wash thoroughly, paying special attention to the area where your surgery will be performed.  7.  Thoroughly rinse your body with warm water from the neck down.  8.  DO NOT shower/wash with your normal soap after using and rinsing off the CHG Soap.  9.  Pat yourself dry with a clean towel.            10.  Wear clean pajamas.            11.  Place clean sheets on your bed the night of your  first shower and do not sleep with pets.  Day of Surgery  Do not apply any lotions/deoderants the morning of surgery.  Please wear clean clothes to the hospital/surgery center.

## 2014-04-28 MED ORDER — CHLORHEXIDINE GLUCONATE 4 % EX LIQD
1.0000 "application " | Freq: Once | CUTANEOUS | Status: DC
  Filled 2014-04-28: qty 15

## 2014-04-28 MED ORDER — CEFAZOLIN SODIUM-DEXTROSE 2-3 GM-% IV SOLR
2.0000 g | INTRAVENOUS | Status: AC
Start: 2014-04-29 — End: 2014-04-29
  Administered 2014-04-29: 2 g via INTRAVENOUS
  Filled 2014-04-28: qty 50

## 2014-04-29 ENCOUNTER — Ambulatory Visit (HOSPITAL_COMMUNITY): Payer: BC Managed Care – PPO | Admitting: Anesthesiology

## 2014-04-29 ENCOUNTER — Encounter (HOSPITAL_COMMUNITY)
Admission: RE | Disposition: A | Discharge status: Home / Self Care | Payer: Self-pay | Source: Ambulatory Visit | Attending: General Surgery

## 2014-04-29 ENCOUNTER — Ambulatory Visit (HOSPITAL_COMMUNITY): Payer: BC Managed Care – PPO

## 2014-04-29 ENCOUNTER — Encounter (HOSPITAL_COMMUNITY): Payer: BC Managed Care – PPO | Admitting: Anesthesiology

## 2014-04-29 ENCOUNTER — Encounter (HOSPITAL_COMMUNITY): Payer: Self-pay | Admitting: Surgery

## 2014-04-29 ENCOUNTER — Ambulatory Visit (HOSPITAL_COMMUNITY)
Admission: RE | Admit: 2014-04-29 | Discharge: 2014-04-29 | Disposition: A | Discharge status: Home / Self Care | Payer: BC Managed Care – PPO | Source: Ambulatory Visit | Attending: General Surgery | Admitting: General Surgery

## 2014-04-29 DIAGNOSIS — K8010 Calculus of gallbladder with chronic cholecystitis without obstruction: Secondary | ICD-10-CM | POA: Insufficient documentation

## 2014-04-29 DIAGNOSIS — K8020 Calculus of gallbladder without cholecystitis without obstruction: Secondary | ICD-10-CM

## 2014-04-29 DIAGNOSIS — K660 Peritoneal adhesions (postprocedural) (postinfection): Secondary | ICD-10-CM | POA: Insufficient documentation

## 2014-04-29 DIAGNOSIS — Z87891 Personal history of nicotine dependence: Secondary | ICD-10-CM | POA: Insufficient documentation

## 2014-04-29 HISTORY — PX: LAPAROSCOPIC LYSIS OF ADHESIONS: SHX5905

## 2014-04-29 HISTORY — PX: CHOLECYSTECTOMY: SHX55

## 2014-04-29 LAB — GLUCOSE, CAPILLARY: Glucose-Capillary: 75 mg/dL (ref 70–99)

## 2014-04-29 SURGERY — LAPAROSCOPIC CHOLECYSTECTOMY WITH INTRAOPERATIVE CHOLANGIOGRAM
Anesthesia: General | Site: Abdomen

## 2014-04-29 MED ORDER — BUPIVACAINE-EPINEPHRINE (PF) 0.25% -1:200000 IJ SOLN
INTRAMUSCULAR | Status: AC
  Filled 2014-04-29: qty 30

## 2014-04-29 MED ORDER — LACTATED RINGERS IV SOLN
INTRAVENOUS | Status: DC | PRN
  Administered 2014-04-29: 08:00:00 via INTRAVENOUS

## 2014-04-29 MED ORDER — FENTANYL CITRATE 0.05 MG/ML IJ SOLN
INTRAMUSCULAR | Status: AC
  Filled 2014-04-29: qty 5

## 2014-04-29 MED ORDER — HYDROMORPHONE HCL PF 1 MG/ML IJ SOLN
INTRAMUSCULAR | Status: AC
  Administered 2014-04-29: 0.5 mg via INTRAVENOUS
  Filled 2014-04-29: qty 1

## 2014-04-29 MED ORDER — 0.9 % SODIUM CHLORIDE (POUR BTL) OPTIME
TOPICAL | Status: DC | PRN
  Administered 2014-04-29: 1000 mL

## 2014-04-29 MED ORDER — GLYCOPYRROLATE 0.2 MG/ML IJ SOLN
INTRAMUSCULAR | Status: DC | PRN
  Administered 2014-04-29: 0.6 mg via INTRAVENOUS

## 2014-04-29 MED ORDER — MIDAZOLAM HCL 2 MG/2ML IJ SOLN
INTRAMUSCULAR | Status: AC
  Filled 2014-04-29: qty 2

## 2014-04-29 MED ORDER — BUPIVACAINE-EPINEPHRINE 0.25% -1:200000 IJ SOLN
INTRAMUSCULAR | Status: DC | PRN
  Administered 2014-04-29: 30 mL

## 2014-04-29 MED ORDER — ROCURONIUM BROMIDE 100 MG/10ML IV SOLN
INTRAVENOUS | Status: DC | PRN
  Administered 2014-04-29: 50 mg via INTRAVENOUS

## 2014-04-29 MED ORDER — PROPOFOL 10 MG/ML IV BOLUS
INTRAVENOUS | Status: AC
  Filled 2014-04-29: qty 20

## 2014-04-29 MED ORDER — DEXAMETHASONE SODIUM PHOSPHATE 4 MG/ML IJ SOLN
INTRAMUSCULAR | Status: DC | PRN
  Administered 2014-04-29: 8 mg via INTRAVENOUS

## 2014-04-29 MED ORDER — SODIUM CHLORIDE 0.9 % IV SOLN
INTRAVENOUS | Status: DC | PRN
  Administered 2014-04-29: 09:00:00

## 2014-04-29 MED ORDER — FENTANYL CITRATE 0.05 MG/ML IJ SOLN
INTRAMUSCULAR | Status: DC | PRN
  Administered 2014-04-29: 150 ug via INTRAVENOUS
  Administered 2014-04-29 (×2): 50 ug via INTRAVENOUS

## 2014-04-29 MED ORDER — ONDANSETRON HCL 4 MG/2ML IJ SOLN
INTRAMUSCULAR | Status: DC | PRN
  Administered 2014-04-29: 4 mg via INTRAVENOUS

## 2014-04-29 MED ORDER — OXYCODONE HCL 5 MG PO TABS: 5.0000 mg | ORAL_TABLET | Freq: Four times a day (QID) | ORAL | Status: DC | PRN

## 2014-04-29 MED ORDER — LIDOCAINE HCL (CARDIAC) 20 MG/ML IV SOLN
INTRAVENOUS | Status: DC | PRN
  Administered 2014-04-29: 50 mg via INTRAVENOUS

## 2014-04-29 MED ORDER — HYDROMORPHONE HCL PF 1 MG/ML IJ SOLN
INTRAMUSCULAR | Status: DC | PRN
  Administered 2014-04-29 (×2): 0.5 mg via INTRAVENOUS

## 2014-04-29 MED ORDER — METOCLOPRAMIDE HCL 5 MG/ML IJ SOLN
INTRAMUSCULAR | Status: AC
  Administered 2014-04-29: 10 mg
  Filled 2014-04-29: qty 2

## 2014-04-29 MED ORDER — LACTATED RINGERS IV SOLN: INTRAVENOUS | Status: DC

## 2014-04-29 MED ORDER — HYDROMORPHONE HCL PF 1 MG/ML IJ SOLN
0.2500 mg | INTRAMUSCULAR | Status: DC | PRN
  Administered 2014-04-29 (×2): 0.5 mg via INTRAVENOUS

## 2014-04-29 MED ORDER — DEXAMETHASONE SODIUM PHOSPHATE 4 MG/ML IJ SOLN
INTRAMUSCULAR | Status: AC
  Administered 2014-04-29: 4 mg
  Filled 2014-04-29: qty 1

## 2014-04-29 MED ORDER — SODIUM CHLORIDE 0.9 % IR SOLN
Status: DC | PRN
  Administered 2014-04-29 (×2): 1000 mL

## 2014-04-29 MED ORDER — NEOSTIGMINE METHYLSULFATE 10 MG/10ML IV SOLN
INTRAVENOUS | Status: DC | PRN
  Administered 2014-04-29: 4 mg via INTRAVENOUS

## 2014-04-29 MED ORDER — PROPOFOL 10 MG/ML IV BOLUS
INTRAVENOUS | Status: DC | PRN
  Administered 2014-04-29: 180 mg via INTRAVENOUS

## 2014-04-29 MED ORDER — MIDAZOLAM HCL 5 MG/5ML IJ SOLN
INTRAMUSCULAR | Status: DC | PRN
  Administered 2014-04-29: 2 mg via INTRAVENOUS

## 2014-04-29 SURGICAL SUPPLY — 41 items
APPLIER CLIP 5 13 M/L LIGAMAX5 (MISCELLANEOUS) ×2
CANISTER SUCTION 2500CC (MISCELLANEOUS) ×2 IMPLANT
CHLORAPREP W/TINT 26ML (MISCELLANEOUS) ×2 IMPLANT
CLIP APPLIE 5 13 M/L LIGAMAX5 (MISCELLANEOUS) ×1 IMPLANT
COVER MAYO STAND STRL (DRAPES) ×2 IMPLANT
COVER SURGICAL LIGHT HANDLE (MISCELLANEOUS) ×2 IMPLANT
DERMABOND ADVANCED (GAUZE/BANDAGES/DRESSINGS) ×1
DERMABOND ADVANCED .7 DNX12 (GAUZE/BANDAGES/DRESSINGS) ×1 IMPLANT
DRAPE C-ARM 42X72 X-RAY (DRAPES) ×2 IMPLANT
DRAPE UTILITY 15X26 W/TAPE STR (DRAPE) ×4 IMPLANT
ELECT REM PT RETURN 9FT ADLT (ELECTROSURGICAL) ×2
ELECTRODE REM PT RTRN 9FT ADLT (ELECTROSURGICAL) ×1 IMPLANT
FILTER SMOKE EVAC LAPAROSHD (FILTER) ×2 IMPLANT
GLOVE BIO SURGEON STRL SZ7 (GLOVE) ×2 IMPLANT
GLOVE BIO SURGEON STRL SZ7.5 (GLOVE) ×2 IMPLANT
GLOVE BIO SURGEON STRL SZ8 (GLOVE) ×4 IMPLANT
GLOVE BIOGEL PI IND STRL 7.5 (GLOVE) ×1 IMPLANT
GLOVE BIOGEL PI IND STRL 8 (GLOVE) ×2 IMPLANT
GLOVE BIOGEL PI INDICATOR 7.5 (GLOVE) ×1
GLOVE BIOGEL PI INDICATOR 8 (GLOVE) ×2
GOWN STRL REUS W/ TWL LRG LVL3 (GOWN DISPOSABLE) ×3 IMPLANT
GOWN STRL REUS W/ TWL XL LVL3 (GOWN DISPOSABLE) ×1 IMPLANT
GOWN STRL REUS W/TWL LRG LVL3 (GOWN DISPOSABLE) ×3
GOWN STRL REUS W/TWL XL LVL3 (GOWN DISPOSABLE) ×1
KIT BASIN OR (CUSTOM PROCEDURE TRAY) ×2 IMPLANT
KIT ROOM TURNOVER OR (KITS) ×2 IMPLANT
NEEDLE 22X1 1/2 (OR ONLY) (NEEDLE) ×2 IMPLANT
NS IRRIG 1000ML POUR BTL (IV SOLUTION) ×2 IMPLANT
PAD ARMBOARD 7.5X6 YLW CONV (MISCELLANEOUS) ×2 IMPLANT
POUCH RETRIEVAL ECOSAC 10 (ENDOMECHANICALS) ×1 IMPLANT
POUCH RETRIEVAL ECOSAC 10MM (ENDOMECHANICALS) ×1
SCISSORS LAP 5X35 DISP (ENDOMECHANICALS) ×2 IMPLANT
SET CHOLANGIOGRAPH 5 50 .035 (SET/KITS/TRAYS/PACK) ×2 IMPLANT
SET IRRIG TUBING LAPAROSCOPIC (IRRIGATION / IRRIGATOR) ×2 IMPLANT
SLEEVE ENDOPATH XCEL 5M (ENDOMECHANICALS) ×4 IMPLANT
SPECIMEN JAR SMALL (MISCELLANEOUS) ×2 IMPLANT
SUT VIC AB 4-0 PS2 27 (SUTURE) ×2 IMPLANT
TOWEL OR 17X26 10 PK STRL BLUE (TOWEL DISPOSABLE) ×2 IMPLANT
TRAY LAPAROSCOPIC (CUSTOM PROCEDURE TRAY) ×2 IMPLANT
TROCAR XCEL BLUNT TIP 100MML (ENDOMECHANICALS) ×2 IMPLANT
TROCAR XCEL NON-BLD 5MMX100MML (ENDOMECHANICALS) ×2 IMPLANT

## 2014-04-29 NOTE — Anesthesia Postprocedure Evaluation (Signed)
  Anesthesia Post-op Note  Patient: Deborah Stevens  Procedure(s) Performed: Procedure(s): LAPAROSCOPIC CHOLECYSTECTOMY WITH INTRAOPERATIVE CHOLANGIOGRAM (N/A) LAPAROSCOPIC LYSIS OF ADHESIONS (N/A)  Patient Location: PACU  Anesthesia Type:General  Level of Consciousness: awake, alert  and oriented  Airway and Oxygen Therapy: Patient Spontanous Breathing and Patient connected to nasal cannula oxygen  Post-op Pain: mild  Post-op Assessment: Post-op Vital signs reviewed, Patient's Cardiovascular Status Stable, Respiratory Function Stable, Patent Airway and Pain level controlled  Post-op Vital Signs: stable  Last Vitals:  Filed Vitals:   04/29/14 1240  BP: 130/77  Pulse: 90  Temp:   Resp: 18    Complications: No apparent anesthesia complications

## 2014-04-29 NOTE — Transfer of Care (Signed)
Immediate Anesthesia Transfer of Care Note  Patient: Deborah Stevens  Procedure(s) Performed: Procedure(s): LAPAROSCOPIC CHOLECYSTECTOMY WITH INTRAOPERATIVE CHOLANGIOGRAM (N/A) LAPAROSCOPIC LYSIS OF ADHESIONS (N/A)  Patient Location: PACU  Anesthesia Type:General  Level of Consciousness: awake, alert , oriented and patient cooperative  Airway & Oxygen Therapy: Patient Spontanous Breathing and Patient connected to nasal cannula oxygen  Post-op Assessment: Report given to PACU RN and Post -op Vital signs reviewed and stable  Post vital signs: Reviewed and stable  Complications: No apparent anesthesia complications

## 2014-04-29 NOTE — Anesthesia Preprocedure Evaluation (Addendum)
Anesthesia Evaluation  Patient identified by MRN, date of birth, ID band Patient awake    Reviewed: Allergy & Precautions, H&P , NPO status , Patient's Chart, lab work & pertinent test results  Airway Mallampati: II TM Distance: >3 FB Neck ROM: Full    Dental  (+) Teeth Intact, Dental Advisory Given, Chipped,    Pulmonary former smoker,          Cardiovascular negative cardio ROS      Neuro/Psych    GI/Hepatic negative GI ROS, Neg liver ROS,   Endo/Other    Renal/GU negative Renal ROS     Musculoskeletal   Abdominal   Peds  Hematology   Anesthesia Other Findings   Reproductive/Obstetrics                          Anesthesia Physical Anesthesia Plan  ASA: II  Anesthesia Plan: General   Post-op Pain Management:    Induction: Intravenous  Airway Management Planned: Oral ETT  Additional Equipment:   Intra-op Plan:   Post-operative Plan: Extubation in OR  Informed Consent:   Dental advisory given  Plan Discussed with: CRNA, Anesthesiologist and Surgeon  Anesthesia Plan Comments:         Anesthesia Quick Evaluation

## 2014-04-29 NOTE — Anesthesia Postprocedure Evaluation (Signed)
  Anesthesia Post-op Note  Patient: Deborah Stevens  Procedure(s) Performed: Procedure(s): LAPAROSCOPIC CHOLECYSTECTOMY WITH INTRAOPERATIVE CHOLANGIOGRAM (N/A) LAPAROSCOPIC LYSIS OF ADHESIONS (N/A)  Patient Location: PACU  Anesthesia Type:General  Level of Consciousness: awake  Airway and Oxygen Therapy: Patient Spontanous Breathing and Patient remains intubated per anesthesia plan  Post-op Pain: mild  Post-op Assessment: Post-op Vital signs reviewed  Post-op Vital Signs: Reviewed  Last Vitals:  Filed Vitals:   04/29/14 0631  BP: 108/84  Pulse: 61  Temp: 36.7 C  Resp: 18    Complications: No apparent anesthesia complications

## 2014-04-29 NOTE — Op Note (Signed)
04/29/2014  10:14 AM  PATIENT:  Deborah Stevens  48 y.o. female  PRE-OPERATIVE DIAGNOSIS:  CHOLELITHIASIS   POST-OPERATIVE DIAGNOSIS:  CHOLELITHIASIS,INTRA-ABDOMINAL ADHESIONS  PROCEDURE:  Procedure(s): LAPAROSCOPIC CHOLECYSTECTOMY WITH INTRAOPERATIVE CHOLANGIOGRAM LAPAROSCOPIC LYSIS OF ADHESIONS 15MIN  SURGEON:  Surgeon(s): Zenovia Jarred, MD  ASSISTANTS: Sharyn Dross, RNFA   ANESTHESIA:   local and general  EBL:     BLOOD ADMINISTERED:none  DRAINS: none   SPECIMEN:  Excision  DISPOSITION OF SPECIMEN:  PATHOLOGY  COUNTS:  YES  DICTATION: .Dragon Dictation  patient presents for cholecystectomy. She was identified in the preop holding area. Informed consent was obtained. She received intravenous antibiotics. She was brought to the operating room and general endotracheal anesthesia was administered by the anesthesia staff. Her abdomen was prepped and draped in sterile fashion. Time out procedure was done. Infra-umbilical region was infiltrated with local. Infraumbilical incision was made. Subcutaneous tissues were dissected down revealing the anterior fascia. This was divided sharply along the midline. Peritoneal cavity was entered carefully under direct vision. 0 Vicryl pursestring suture was placed on the fascial opening. Hassan trocar was inserted. Abdomen was insufflated with carbon dioxide in standard fashion. Laparoscopic exploration revealed multiple upper abdominal adhesions from the abdominal wall down to the omentum and the liver. 2 right lateral 5 mm ports were placed under direct vision. Local was used at each port site. Sharp dissection was used to take down these adhesions. Laparoscopic lysis of adhesions was done for 15 minutes. There is no significant bleeding. No bowel involvement. Next epigastric 5 mm port was also placed under direct vision. Local was used at the port site. The gallbladder was retracted superior medially. The infundibulum was retracted  inferolaterally. There were some filmy adhesions to the duodenum which were swept down. Further dissection clearly identified in the cystic duct and cystic artery. The cystic artery was somewhat in the way so was fully dissected, clipped twice proximally, once distally and divided. Further dissection created a critical view around the cystic duct. Clip was placed on the infundibular cystic duct junction. A small nick was made in the cystic duct. Intraoperative cholangiogram was obtained demonstrating no common bile duct filling defects. There was good flow of contrast into the duodenum. Cholangiogram catheter was removed. 3 clips were placed proximally and one was placed distally and the cystic duct was divided. Gallbladder was taken off the liver bed with Bovie cautery. Excellent hemostasis was obtained. Gallbladder was placed in an Eco-sac and removed from the abdomen via the infraumbilical port site. Abdomen was copiously irrigated. Hemostasis was ensured and the liver bed. Clips all remain in good position. Inspection of the abdomen revealed no bleeding from the adhesiolysis. Ports were removed under direct vision. Pneumoperitoneum was released. The umbilical fascia was closed by tying the pursestring. All 4 wounds were copiously irrigated and the skin of each was closed with running 4-0 Vicryl subcuticular followed by Dermabond. All counts were correct. Patient tolerated procedure well without apparent complication was taken to to the recovery room in stable condition.  PATIENT DISPOSITION:  PACU - hemodynamically stable.   Delay start of Pharmacological VTE agent (>24hrs) due to surgical blood loss or risk of bleeding:  no  Georganna Skeans, MD, MPH, FACS Pager: [phone]  5/8/201510:14 AM

## 2014-04-29 NOTE — H&P (View-Only) (Signed)
Patient ID: Deborah Stevens, female   DOB: 02/19/1966, 48 y.o.   MRN: [id number]  Chief Complaint  Patient presents with  . New Evaluation    eval biliary colic    HPI Deborah Stevens is a 48 y.o. female.  Chief complaint: Right upper quadrant pain and gallstones HPI Patient developed an acute onset right quadrant abdominal pain. This was very severe. Was in her epigastric region and she was evaluated at the emergency department. She was found to have multiple gallstones without evidence of acute cholecystitis. She was discharged. Since that time, she has had several more mild attacks with epigastric pain. It has been affecting her at work. No change in bowel habits. She has not had any previous surgeries.I was asked to see her in consultation by Dr. Ernie Hew. History reviewed. No pertinent past medical history.  History reviewed. No pertinent past surgical history.  History reviewed. No pertinent family history.  Social History History  Substance Use Topics  . Smoking status: Former Smoker    Quit date: 04/08/2013  . Smokeless tobacco: Not on file  . Alcohol Use: Yes     Comment: occasional    No Known Allergies  No current outpatient prescriptions on file.   No current facility-administered medications for this visit.    Review of Systems Review of Systems  Constitutional: Negative for fever, chills and unexpected weight change.  HENT: Negative for congestion, hearing loss, sore throat, trouble swallowing and voice change.   Eyes: Negative for visual disturbance.  Respiratory: Negative for cough and wheezing.   Cardiovascular: Negative for chest pain, palpitations and leg swelling.  Gastrointestinal: Positive for nausea and abdominal pain. Negative for vomiting, diarrhea, constipation, blood in stool, abdominal distention and anal bleeding.  Genitourinary: Negative for hematuria, vaginal bleeding and difficulty urinating.  Musculoskeletal: Negative for arthralgias.  Skin:  Negative for rash and wound.  Neurological: Negative for seizures, syncope and headaches.  Hematological: Negative for adenopathy. Does not bruise/bleed easily.  Psychiatric/Behavioral: Negative for confusion.    Blood pressure 122/86, pulse 78, temperature 97.8 F (36.6 C), temperature source Temporal, resp. rate 14, height 5\' 8"  (1.727 m), weight 195 lb (88.451 kg), last menstrual period 03/16/2014.  Physical Exam Physical Exam  Constitutional: She is oriented to person, place, and time. She appears well-developed and well-nourished. No distress.  HENT:  Head: Normocephalic and atraumatic.  Eyes: EOM are normal. Pupils are equal, round, and reactive to light.  Neck: Normal range of motion. Neck supple. No tracheal deviation present.  Cardiovascular: Normal rate, regular rhythm and normal heart sounds.   Pulmonary/Chest: Effort normal and breath sounds normal. No stridor. No respiratory distress. She has no wheezes. She has no rales.  Abdominal: Soft. Bowel sounds are normal. She exhibits no distension. There is tenderness. There is no rebound and no guarding.  Minimal right upper quadrant tenderness to deep palpation  Musculoskeletal: Normal range of motion. She exhibits no tenderness.  Lymphadenopathy:    She has no cervical adenopathy.  Neurological: She is alert and oriented to person, place, and time.  Skin: Skin is warm and dry.  Psychiatric: She has a normal mood and affect.    Data Reviewed U/S  Assessment    Symptomatic cholelithiasis     Plan    I've offered laparoscopic cholecystectomy with cholangiogram. Procedure, risks, benefits were discussed in detail with the patient. I gave her some educational literature. She looks forward to scheduling in the future. We will plan outpatient.  Zenovia Jarred 04/08/2014, 11:42 AM

## 2014-04-29 NOTE — Interval H&P Note (Signed)
History and Physical Interval Note:  04/29/2014 6:56 AM  Deborah Stevens  has presented today for surgery, with the diagnosis of CHOLELITHIASIS   The various methods of treatment have been discussed with the patient and family. After consideration of risks, benefits and other options for treatment, the patient has consented to  Procedure(s): LAPAROSCOPIC CHOLECYSTECTOMY WITH INTRAOPERATIVE CHOLANGIOGRAM (N/A) as a surgical intervention .  The patient's history has been reviewed, patient re-examined, no change in status, stable for surgery.  I have reviewed the patient's chart and labs.  Questions were answered to the patient's satisfaction.     Zenovia Jarred

## 2014-05-02 ENCOUNTER — Encounter (INDEPENDENT_AMBULATORY_CARE_PROVIDER_SITE_OTHER): Payer: Self-pay

## 2014-05-02 ENCOUNTER — Encounter (HOSPITAL_COMMUNITY): Payer: Self-pay | Admitting: General Surgery

## 2014-05-02 ENCOUNTER — Telehealth (INDEPENDENT_AMBULATORY_CARE_PROVIDER_SITE_OTHER): Payer: Self-pay

## 2014-05-02 NOTE — Telephone Encounter (Signed)
She may go back with the restriction to not lift over 20lbs for 2 more weeks. Please write the work not for her stating that. Thx and happy Monday.

## 2014-05-02 NOTE — Telephone Encounter (Signed)
Pt has po appt for 05-11-14. Pt wants to return to work on 05-09-14. Pt wanting to know if she returns to work on 5-18 will there be any restrictions. If we can do RTW note indicating rtw date of 5-18 and list any restrictions. Pt would like this note at front desk and she will pick it up. I advised her since she is returning to work a few days prior to out usual 14 days I need to send msg to Dr Grandville Silos to be sure she will have not restrictions.

## 2014-05-10 ENCOUNTER — Other Ambulatory Visit (INDEPENDENT_AMBULATORY_CARE_PROVIDER_SITE_OTHER): Payer: Self-pay | Admitting: General Surgery

## 2014-05-10 NOTE — Telephone Encounter (Signed)
Will route request for medication refill in epic for pain med to Dr Grandville Silos to review.

## 2014-05-10 NOTE — Telephone Encounter (Signed)
That is fine if someone there can write it this PM. Otherwise, I am there tomorrow for a short office.

## 2014-05-10 NOTE — Telephone Encounter (Signed)
Pt called back and is only have slight soreness. No fever,nausea, or severe abd pain.  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 She states she will try advil instead and keep ov tomorrow for po ck. Pt requests we disregaurd request for narcotic refill.

## 2014-05-10 NOTE — Telephone Encounter (Signed)
I have tried to reach pt re: pain med needs. Pt RTW 05-09-14. Unable to reach pt. Lmom to call back.

## 2014-05-11 ENCOUNTER — Encounter (INDEPENDENT_AMBULATORY_CARE_PROVIDER_SITE_OTHER): Payer: Self-pay | Admitting: General Surgery

## 2014-05-11 ENCOUNTER — Ambulatory Visit (INDEPENDENT_AMBULATORY_CARE_PROVIDER_SITE_OTHER): Payer: BC Managed Care – PPO | Admitting: General Surgery

## 2014-05-11 VITALS — BP 128/78 | HR 78 | Temp 99.0°F | Resp 18 | Ht 68.0 in | Wt 191.0 lb

## 2014-05-11 DIAGNOSIS — K8020 Calculus of gallbladder without cholecystitis without obstruction: Secondary | ICD-10-CM

## 2014-05-11 NOTE — Progress Notes (Signed)
Subjective:     Patient ID: Deborah Stevens, female   DOB: [birth year], 48 y.o.   MRN: [id number]  HPI S/P lap chole, IOC. Having some aching along rib near [facility] site. No longer taking oxy. Advil PRN.  Review of Systems     Objective:   Physical Exam Abdomen soft and NT, incisions OK    Assessment:     S/P lap chole    Plan:     Pathology reviewed. Likely mild rib inflammation from IOC site. Advil PRN. F/U PRN.

## 2014-11-09 ENCOUNTER — Other Ambulatory Visit: Payer: Self-pay | Admitting: Obstetrics and Gynecology

## 2014-11-09 DIAGNOSIS — R928 Other abnormal and inconclusive findings on diagnostic imaging of breast: Secondary | ICD-10-CM

## 2014-11-28 ENCOUNTER — Ambulatory Visit
Admission: RE | Admit: 2014-11-28 | Discharge: 2014-11-28 | Disposition: A | Discharge status: Home / Self Care | Payer: BC Managed Care – PPO | Source: Ambulatory Visit | Attending: Obstetrics and Gynecology | Admitting: Obstetrics and Gynecology

## 2014-11-28 ENCOUNTER — Other Ambulatory Visit: Payer: Self-pay | Admitting: Obstetrics and Gynecology

## 2014-11-28 DIAGNOSIS — R928 Other abnormal and inconclusive findings on diagnostic imaging of breast: Secondary | ICD-10-CM

## 2015-02-17 ENCOUNTER — Ambulatory Visit (INDEPENDENT_AMBULATORY_CARE_PROVIDER_SITE_OTHER): Payer: BLUE CROSS/BLUE SHIELD | Admitting: Licensed Clinical Social Worker

## 2015-02-17 DIAGNOSIS — F4323 Adjustment disorder with mixed anxiety and depressed mood: Secondary | ICD-10-CM

## 2015-02-22 ENCOUNTER — Ambulatory Visit (INDEPENDENT_AMBULATORY_CARE_PROVIDER_SITE_OTHER): Payer: BLUE CROSS/BLUE SHIELD | Admitting: Licensed Clinical Social Worker

## 2015-02-22 DIAGNOSIS — F4323 Adjustment disorder with mixed anxiety and depressed mood: Secondary | ICD-10-CM | POA: Diagnosis not present

## 2015-02-24 ENCOUNTER — Emergency Department (HOSPITAL_COMMUNITY)
Admission: EM | Admit: 2015-02-24 | Discharge: 2015-02-24 | Disposition: A | Discharge status: Home / Self Care | Payer: BLUE CROSS/BLUE SHIELD | Attending: Emergency Medicine | Admitting: Emergency Medicine

## 2015-02-24 ENCOUNTER — Encounter (HOSPITAL_COMMUNITY): Payer: Self-pay | Admitting: *Deleted

## 2015-02-24 ENCOUNTER — Emergency Department (HOSPITAL_COMMUNITY): Payer: BLUE CROSS/BLUE SHIELD

## 2015-02-24 DIAGNOSIS — R51 Headache: Secondary | ICD-10-CM | POA: Insufficient documentation

## 2015-02-24 DIAGNOSIS — Z8659 Personal history of other mental and behavioral disorders: Secondary | ICD-10-CM | POA: Diagnosis not present

## 2015-02-24 DIAGNOSIS — S299XXA Unspecified injury of thorax, initial encounter: Secondary | ICD-10-CM | POA: Diagnosis not present

## 2015-02-24 DIAGNOSIS — R52 Pain, unspecified: Secondary | ICD-10-CM

## 2015-02-24 DIAGNOSIS — Y9389 Activity, other specified: Secondary | ICD-10-CM | POA: Insufficient documentation

## 2015-02-24 DIAGNOSIS — S60222A Contusion of left hand, initial encounter: Secondary | ICD-10-CM | POA: Insufficient documentation

## 2015-02-24 DIAGNOSIS — S199XXA Unspecified injury of neck, initial encounter: Secondary | ICD-10-CM | POA: Diagnosis not present

## 2015-02-24 DIAGNOSIS — S8012XA Contusion of left lower leg, initial encounter: Secondary | ICD-10-CM | POA: Diagnosis not present

## 2015-02-24 DIAGNOSIS — S60512A Abrasion of left hand, initial encounter: Secondary | ICD-10-CM | POA: Diagnosis not present

## 2015-02-24 DIAGNOSIS — Z79899 Other long term (current) drug therapy: Secondary | ICD-10-CM | POA: Insufficient documentation

## 2015-02-24 DIAGNOSIS — S3992XA Unspecified injury of lower back, initial encounter: Secondary | ICD-10-CM | POA: Diagnosis not present

## 2015-02-24 DIAGNOSIS — S8011XA Contusion of right lower leg, initial encounter: Secondary | ICD-10-CM | POA: Insufficient documentation

## 2015-02-24 DIAGNOSIS — S60221A Contusion of right hand, initial encounter: Secondary | ICD-10-CM | POA: Diagnosis not present

## 2015-02-24 DIAGNOSIS — Z87891 Personal history of nicotine dependence: Secondary | ICD-10-CM | POA: Diagnosis not present

## 2015-02-24 DIAGNOSIS — Y92410 Unspecified street and highway as the place of occurrence of the external cause: Secondary | ICD-10-CM | POA: Insufficient documentation

## 2015-02-24 DIAGNOSIS — Y998 Other external cause status: Secondary | ICD-10-CM | POA: Insufficient documentation

## 2015-02-24 HISTORY — DX: Anxiety disorder, unspecified: F41.9

## 2015-02-24 MED ORDER — HYDROCODONE-ACETAMINOPHEN 5-325 MG PO TABS: 1.0000 | ORAL_TABLET | Freq: Four times a day (QID) | ORAL | Status: DC | PRN

## 2015-02-24 MED ORDER — HYDROCODONE-ACETAMINOPHEN 5-325 MG PO TABS
2.0000 | ORAL_TABLET | Freq: Once | ORAL | Status: AC
  Administered 2015-02-24: 2 via ORAL
  Filled 2015-02-24: qty 2
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
## 2015-03-10 ENCOUNTER — Ambulatory Visit (INDEPENDENT_AMBULATORY_CARE_PROVIDER_SITE_OTHER): Payer: BLUE CROSS/BLUE SHIELD | Admitting: Licensed Clinical Social Worker

## 2015-03-10 DIAGNOSIS — F4323 Adjustment disorder with mixed anxiety and depressed mood: Secondary | ICD-10-CM | POA: Diagnosis not present

## 2015-04-10 ENCOUNTER — Ambulatory Visit: Payer: BLUE CROSS/BLUE SHIELD | Admitting: Licensed Clinical Social Worker

## 2015-05-08 ENCOUNTER — Ambulatory Visit (INDEPENDENT_AMBULATORY_CARE_PROVIDER_SITE_OTHER): Payer: BLUE CROSS/BLUE SHIELD | Admitting: Licensed Clinical Social Worker

## 2015-05-08 DIAGNOSIS — F4323 Adjustment disorder with mixed anxiety and depressed mood: Secondary | ICD-10-CM | POA: Diagnosis not present

## 2015-05-29 ENCOUNTER — Ambulatory Visit: Payer: BLUE CROSS/BLUE SHIELD | Admitting: Licensed Clinical Social Worker

## 2016-07-15 DIAGNOSIS — Z8659 Personal history of other mental and behavioral disorders: Secondary | ICD-10-CM | POA: Insufficient documentation

## 2016-07-15 DIAGNOSIS — F321 Major depressive disorder, single episode, moderate: Secondary | ICD-10-CM

## 2016-07-15 DIAGNOSIS — F5104 Psychophysiologic insomnia: Secondary | ICD-10-CM | POA: Diagnosis not present

## 2016-07-15 HISTORY — DX: Major depressive disorder, single episode, moderate: F32.1

## 2016-09-27 DIAGNOSIS — Z23 Encounter for immunization: Secondary | ICD-10-CM | POA: Diagnosis not present

## 2017-02-01 ENCOUNTER — Encounter (HOSPITAL_COMMUNITY): Payer: Self-pay | Admitting: *Deleted

## 2017-02-01 ENCOUNTER — Emergency Department (HOSPITAL_COMMUNITY)
Admission: EM | Admit: 2017-02-01 | Discharge: 2017-02-01 | Disposition: A | Discharge status: Home / Self Care | Payer: BLUE CROSS/BLUE SHIELD | Attending: Emergency Medicine | Admitting: Emergency Medicine

## 2017-02-01 DIAGNOSIS — F17200 Nicotine dependence, unspecified, uncomplicated: Secondary | ICD-10-CM | POA: Diagnosis not present

## 2017-02-01 DIAGNOSIS — Z79899 Other long term (current) drug therapy: Secondary | ICD-10-CM | POA: Insufficient documentation

## 2017-02-01 DIAGNOSIS — N939 Abnormal uterine and vaginal bleeding, unspecified: Secondary | ICD-10-CM | POA: Diagnosis present

## 2017-02-01 DIAGNOSIS — N938 Other specified abnormal uterine and vaginal bleeding: Secondary | ICD-10-CM | POA: Insufficient documentation

## 2017-02-01 LAB — URINALYSIS, ROUTINE W REFLEX MICROSCOPIC
Bilirubin Urine: NEGATIVE
Glucose, UA: NEGATIVE mg/dL
Ketones, ur: NEGATIVE mg/dL
Leukocytes, UA: NEGATIVE
Nitrite: NEGATIVE
Protein, ur: 30 mg/dL — AB
Specific Gravity, Urine: 1.023 (ref 1.005–1.030)
pH: 5 (ref 5.0–8.0)

## 2017-02-01 LAB — BASIC METABOLIC PANEL
Anion gap: 8 (ref 5–15)
BUN: 8 mg/dL (ref 6–20)
CO2: 26 mmol/L (ref 22–32)
Calcium: 9.1 mg/dL (ref 8.9–10.3)
Chloride: 105 mmol/L (ref 101–111)
Creatinine, Ser: 0.94 mg/dL (ref 0.44–1.00)
GFR calc Af Amer: >60 mL/min (ref >60)
GFR calc non Af Amer: >60 mL/min (ref >60)
Glucose, Bld: 107 mg/dL — ABNORMAL HIGH (ref 65–99)
Potassium: 4.4 mmol/L (ref 3.5–5.1)
Sodium: 139 mmol/L (ref 135–145)

## 2017-02-01 LAB — PREGNANCY, URINE: Preg Test, Ur: NEGATIVE

## 2017-02-01 LAB — CBC
HCT: 39.6 % (ref 36.0–46.0)
Hemoglobin: 13.2 g/dL (ref 12.0–15.0)
MCH: 29.8 pg (ref 26.0–34.0)
MCHC: 33.3 g/dL (ref 30.0–36.0)
MCV: 89.4 fL (ref 78.0–100.0)
Platelets: 252 10*3/uL (ref 150–400)
RBC: 4.43 MIL/uL (ref 3.87–5.11)
RDW: 13.1 % (ref 11.5–15.5)
WBC: 7.6 10*3/uL (ref 4.0–10.5)

## 2017-02-01 NOTE — ED Provider Notes (Signed)
[facility] DEPT Provider Note   CSN: [id number] Arrival date & time: 02/01/17  1511     History   Chief Complaint Chief Complaint  Patient presents with  . Vaginal Bleeding    HPI Deborah Stevens is a 51 y.o. female.  Patient is a 51 year old female who presents with vaginal bleeding. She states over the last year she's had irregular periods and she feels that she's perimenopausal. Her last period was in November 2017. She states it started again on February 8. She states it's been very heavy over the last 2 days where she is changing pads about every hour. She's also felt a little lightheaded. She has lower abdominal cramping no other pain. No fevers. No abnormal vaginal discharge.      Past Medical History:  Diagnosis Date  . Anxiety     Patient Active Problem List   Diagnosis Date Noted  . Symptomatic cholelithiasis 04/08/2014    Past Surgical History:  Procedure Laterality Date  . CHOLECYSTECTOMY N/A 04/29/2014   Procedure: LAPAROSCOPIC CHOLECYSTECTOMY WITH INTRAOPERATIVE CHOLANGIOGRAM;  Surgeon: Zenovia Jarred, MD;  Location: [facility];  Service: General;  Laterality: N/A;  . LAPAROSCOPIC LYSIS OF ADHESIONS N/A 04/29/2014   Procedure: LAPAROSCOPIC LYSIS OF ADHESIONS;  Surgeon: Zenovia Jarred, MD;  Location: [facility];  Service: General;  Laterality: N/A;    OB History    No data available       Home Medications    Prior to Admission medications   Medication Sig Start Date End Date Taking? Authorizing Provider  acetaminophen (TYLENOL) 500 MG tablet Take 1,000 mg by mouth every 6 (six) hours as needed for mild pain or moderate pain.   Yes Historical Provider, MD  ibuprofen (ADVIL,MOTRIN) 200 MG tablet Take 400 mg by mouth every 6 (six) hours as needed for headache, mild pain or moderate pain.   Yes Historical Provider, MD    Family History No family history on file.  Social History Social History  Substance Use Topics  . Smoking status: Current Every Day  Smoker    Last attempt to quit: 04/08/2013  . Smokeless tobacco: Never Used  . Alcohol use Yes     Comment: occasional     Allergies   Patient has no known allergies.   Review of Systems Review of Systems  Constitutional: Negative for chills, diaphoresis, fatigue and fever.  HENT: Negative for congestion, rhinorrhea and sneezing.   Eyes: Negative.   Respiratory: Negative for cough, chest tightness and shortness of breath.   Cardiovascular: Negative for chest pain and leg swelling.  Gastrointestinal: Negative for abdominal pain, blood in stool, diarrhea, nausea and vomiting.  Genitourinary: Positive for pelvic pain (Cramping) and vaginal bleeding. Negative for difficulty urinating, flank pain, frequency, hematuria and vaginal discharge.  Musculoskeletal: Negative for arthralgias and back pain.  Skin: Negative for rash.  Neurological: Negative for dizziness, speech difficulty, weakness, numbness and headaches.     Physical Exam Updated Vital Signs BP 152/89 (BP Location: Left Arm)   Pulse (!) 55   Temp 98.6 F (37 C) (Oral)   Resp 26   Ht 5\' 6"  (1.676 m)   Wt 201 lb (91.2 kg)   LMP 01/30/2017   SpO2 100%   BMI 32.44 kg/m   Physical Exam  Constitutional: She is oriented to person, place, and time. She appears well-developed and well-nourished.  HENT:  Head: Normocephalic and atraumatic.  Eyes: Pupils are equal, round, and reactive to light.  Neck: Normal range of motion. Neck  supple.  Cardiovascular: Normal rate, regular rhythm and normal heart sounds.   Pulmonary/Chest: Effort normal and breath sounds normal. No respiratory distress. She has no wheezes. She has no rales. She exhibits no tenderness.  Abdominal: Soft. Bowel sounds are normal. There is no tenderness. There is no rebound and no guarding.  Genitourinary:  Genitourinary Comments: Patient has dark bleeding through the os, moderate amount. No heavy active bleeding. No cervical motion tenderness or adnexal  tenderness  Musculoskeletal: Normal range of motion. She exhibits no edema.  Lymphadenopathy:    She has no cervical adenopathy.  Neurological: She is alert and oriented to person, place, and time.  Skin: Skin is warm and dry. No rash noted.  Psychiatric: She has a normal mood and affect.     ED Treatments / Results  Labs (all labs ordered are listed, but only abnormal results are displayed) Labs Reviewed  BASIC METABOLIC PANEL - Abnormal; Notable for the following:       Result Value   Glucose, Bld 107 (*)    All other components within normal limits  URINALYSIS, ROUTINE W REFLEX MICROSCOPIC - Abnormal; Notable for the following:    APPearance CLOUDY (*)    Hgb urine dipstick LARGE (*)    Protein, ur 30 (*)    Bacteria, UA RARE (*)    Squamous Epithelial / LPF 0-5 (*)    Crystals PRESENT (*)    All other components within normal limits  CBC  PREGNANCY, URINE    EKG  EKG Interpretation None       Radiology No results found.  Procedures Procedures (including critical care time)  Medications Ordered in ED Medications - No data to display   Initial Impression / Assessment and Plan / ED Course  I have reviewed the triage vital signs and the nursing notes.  Pertinent labs & imaging results that were available during my care of the patient were reviewed by me and considered in my medical decision making (see chart for details).     Patient presents with vaginal bleeding. Her hemoglobin is stable. Her pregnancy test is negative. Her pelvic exam is non-concerning. She was discharged home in good condition. She was encouraged to follow-up with her OB/GYN.  Final Clinical Impressions(s) / ED Diagnoses   Final diagnoses:  Dysfunctional uterine bleeding    New Prescriptions New Prescriptions   No medications on file     Malvin Johns, MD 02/01/17 2255

## 2017-02-01 NOTE — ED Triage Notes (Signed)
Pt states since Thursday she has had heavy vaginal bleeding and abdominal cramping. States has changed 6 pads since 7 am. She says that she also feels dizzy, weak and having a headache. Pt last took 2 tylenol about 1 hour ago.

## 2017-02-21 ENCOUNTER — Encounter: Payer: Self-pay | Admitting: Internal Medicine

## 2017-02-22 DIAGNOSIS — H52223 Regular astigmatism, bilateral: Secondary | ICD-10-CM | POA: Diagnosis not present

## 2017-03-12 DIAGNOSIS — Z130 Encounter for screening for diseases of the blood and blood-forming organs and certain disorders involving the immune mechanism: Secondary | ICD-10-CM | POA: Diagnosis not present

## 2017-03-12 DIAGNOSIS — G8929 Other chronic pain: Secondary | ICD-10-CM | POA: Diagnosis not present

## 2017-03-12 DIAGNOSIS — M545 Low back pain: Secondary | ICD-10-CM | POA: Diagnosis not present

## 2017-03-12 DIAGNOSIS — Z1151 Encounter for screening for human papillomavirus (HPV): Secondary | ICD-10-CM | POA: Diagnosis not present

## 2017-03-12 DIAGNOSIS — N926 Irregular menstruation, unspecified: Secondary | ICD-10-CM | POA: Diagnosis not present

## 2017-03-12 DIAGNOSIS — Z1389 Encounter for screening for other disorder: Secondary | ICD-10-CM | POA: Diagnosis not present

## 2017-03-12 DIAGNOSIS — Z01419 Encounter for gynecological examination (general) (routine) without abnormal findings: Secondary | ICD-10-CM | POA: Diagnosis not present

## 2017-03-12 DIAGNOSIS — Z124 Encounter for screening for malignant neoplasm of cervix: Secondary | ICD-10-CM | POA: Diagnosis not present

## 2017-03-12 DIAGNOSIS — Z1231 Encounter for screening mammogram for malignant neoplasm of breast: Secondary | ICD-10-CM | POA: Diagnosis not present

## 2017-03-12 DIAGNOSIS — M25552 Pain in left hip: Secondary | ICD-10-CM | POA: Diagnosis not present

## 2017-03-14 DIAGNOSIS — Z124 Encounter for screening for malignant neoplasm of cervix: Secondary | ICD-10-CM | POA: Diagnosis not present

## 2017-03-14 LAB — HM PAP SMEAR: HM Pap smear: NEGATIVE

## 2017-04-14 ENCOUNTER — Ambulatory Visit (AMBULATORY_SURGERY_CENTER): Payer: Self-pay | Admitting: *Deleted

## 2017-04-14 VITALS — Ht 69.0 in | Wt 218.0 lb

## 2017-04-14 DIAGNOSIS — Z1211 Encounter for screening for malignant neoplasm of colon: Secondary | ICD-10-CM

## 2017-04-14 DIAGNOSIS — Z8249 Family history of ischemic heart disease and other diseases of the circulatory system: Secondary | ICD-10-CM | POA: Insufficient documentation

## 2017-04-14 DIAGNOSIS — IMO0002 Reserved for concepts with insufficient information to code with codable children: Secondary | ICD-10-CM | POA: Insufficient documentation

## 2017-04-14 MED ORDER — POLYETHYLENE GLYCOL 3350 17 GM/SCOOP PO POWD: 1.0000 | Freq: Once | ORAL | 0 refills | Status: AC

## 2017-04-14 MED ORDER — BISACODYL 5 MG PO TBEC: 5.0000 mg | DELAYED_RELEASE_TABLET | ORAL | 0 refills | Status: DC

## 2017-04-14 NOTE — Progress Notes (Signed)
Patient denies any allergies to eggs or soy. Patient denies any problems with anesthesia/sedation. Patient denies any oxygen use at home and does not take any diet/weight loss medications. EMMI education assisgned to patient on colonoscopy, this was explained and instructions given to patient. 

## 2017-04-15 ENCOUNTER — Encounter: Payer: Self-pay | Admitting: Internal Medicine

## 2017-04-22 HISTORY — PX: COLONOSCOPY W/ POLYPECTOMY: SHX1380

## 2017-04-28 ENCOUNTER — Encounter: Payer: Self-pay | Admitting: Internal Medicine

## 2017-04-28 ENCOUNTER — Ambulatory Visit (AMBULATORY_SURGERY_CENTER): Payer: BLUE CROSS/BLUE SHIELD | Admitting: Internal Medicine

## 2017-04-28 VITALS — BP 106/69 | HR 72 | Temp 97.1°F | Resp 17 | Ht 69.0 in | Wt 218.0 lb

## 2017-04-28 DIAGNOSIS — Z1211 Encounter for screening for malignant neoplasm of colon: Secondary | ICD-10-CM

## 2017-04-28 DIAGNOSIS — Z1212 Encounter for screening for malignant neoplasm of rectum: Secondary | ICD-10-CM | POA: Diagnosis not present

## 2017-04-28 DIAGNOSIS — D122 Benign neoplasm of ascending colon: Secondary | ICD-10-CM

## 2017-04-28 DIAGNOSIS — K635 Polyp of colon: Secondary | ICD-10-CM | POA: Diagnosis not present

## 2017-04-28 DIAGNOSIS — D125 Benign neoplasm of sigmoid colon: Secondary | ICD-10-CM | POA: Diagnosis not present

## 2017-04-28 MED ORDER — SODIUM CHLORIDE 0.9 % IV SOLN: 500.0000 mL | INTRAVENOUS | Status: DC

## 2017-04-28 NOTE — Progress Notes (Signed)
Pt. Reports no change in her surgical or medical history since pre-visit 04/14/2017.

## 2017-04-28 NOTE — Progress Notes (Signed)
Report given to PACU, vss 

## 2017-04-28 NOTE — Op Note (Signed)
[facility] Patient Name: Deborah Stevens Procedure Date: 04/28/2017 10:02 AM MRN: [id number] Endoscopist: Gatha Mayer , MD Age: 51 Referring MD:  Date of Birth: [birth year] Gender: Female Account #: [id number] Procedure:                Colonoscopy Indications:              Screening for colorectal malignant neoplasm, This                            is the patient's first colonoscopy Medicines:                Propofol per Anesthesia, Monitored Anesthesia Care Procedure:                Pre-Anesthesia Assessment:                           - Prior to the procedure, a History and Physical                            was performed, and patient medications and                            allergies were reviewed. The patient's tolerance of                            previous anesthesia was also reviewed. The risks                            and benefits of the procedure and the sedation                            options and risks were discussed with the patient.                            All questions were answered, and informed consent                            was obtained. Prior Anticoagulants: The patient has                            taken no previous anticoagulant or antiplatelet                            agents. ASA Grade Assessment: I - A normal, healthy                            patient. After reviewing the risks and benefits,                            the patient was deemed in satisfactory condition to                            undergo the procedure.  After obtaining informed consent, the colonoscope                            was passed under direct vision. Throughout the                            procedure, the patient's blood pressure, pulse, and                            oxygen saturations were monitored continuously. The                            Colonoscope was introduced through the anus and                            advanced to  the the cecum, identified by                            appendiceal orifice and ileocecal valve. The                            colonoscopy was performed without difficulty. The                            patient tolerated the procedure well. The quality                            of the bowel preparation was excellent. The bowel                            preparation used was Miralax. The ileocecal valve,                            appendiceal orifice, and rectum were photographed. Scope In: 10:10:56 AM Scope Out: 10:25:54 AM Scope Withdrawal Time: 0 hours 11 minutes 17 seconds  Total Procedure Duration: 0 hours 14 minutes 58 seconds  Findings:                 The perianal and digital rectal examinations were                            normal.                           A 5 mm polyp was found in the sigmoid colon. The                            polyp was sessile. The polyp was removed with a                            cold snare. Resection and retrieval were complete.                            Verification of patient identification for the  specimen was done. Estimated blood loss was minimal.                           A 2 mm polyp was found in the ascending colon. The                            polyp was sessile. The polyp was removed with a                            cold biopsy forceps. Resection and retrieval were                            complete. Verification of patient identification                            for the specimen was done. Estimated blood loss was                            minimal.                           A few small-mouthed diverticula were found in the                            sigmoid colon.                           The exam was otherwise without abnormality on                            direct and retroflexion views. Complications:            No immediate complications. Estimated Blood Loss:     Estimated blood loss was  minimal. Impression:               - One 5 mm polyp in the sigmoid colon, removed with                            a cold snare. Resected and retrieved.                           - One 2 mm polyp in the ascending colon, removed                            with a cold biopsy forceps. Resected and retrieved.                           - Diverticulosis in the sigmoid colon.                           - The examination was otherwise normal on direct                            and retroflexion views. Recommendation:           -  Patient has a contact number available for                            emergencies. The signs and symptoms of potential                            delayed complications were discussed with the                            patient. Return to normal activities tomorrow.                            Written discharge instructions were provided to the                            patient.                           - Resume previous diet.                           - Continue present medications.                           - Repeat colonoscopy is recommended. The                            colonoscopy date will be determined after pathology                            results from today's exam become available for                            review. Gatha Mayer, MD 04/28/2017 10:34:45 AM This report has been signed electronically.

## 2017-04-28 NOTE — Progress Notes (Signed)
Called to room to assist during endoscopic procedure.  Patient ID and intended procedure confirmed with present staff. Received instructions for my participation in the procedure from the performing physician.  

## 2017-04-28 NOTE — Patient Instructions (Addendum)
I found and removed 2 tiny polyps. I will let you know pathology results and when to have another routine colonoscopy by mail and/or My Chart.  You also have a condition called diverticulosis - common and not usually a problem. Please read the handout provided. Yours is minimal.  I appreciate the opportunity to care for you. Gatha Mayer, MD, FACG     YOU HAD AN ENDOSCOPIC PROCEDURE TODAY AT [facility]:   Refer to the procedure report that was given to you for any specific questions about what was found during the examination.  If the procedure report does not answer your questions, please call your gastroenterologist to clarify.  If you requested that your care partner not be given the details of your procedure findings, then the procedure report has been included in a sealed envelope for you to review at your convenience later.  YOU SHOULD EXPECT: Some feelings of bloating in the abdomen. Passage of more gas than usual.  Walking can help get rid of the air that was put into your GI tract during the procedure and reduce the bloating. If you had a lower endoscopy (such as a colonoscopy or flexible sigmoidoscopy) you may notice spotting of blood in your stool or on the toilet paper. If you underwent a bowel prep for your procedure, you may not have a normal bowel movement for a few days.  Please Note:  You might notice some irritation and congestion in your nose or some drainage.  This is from the oxygen used during your procedure.  There is no need for concern and it should clear up in a day or so.  SYMPTOMS TO REPORT IMMEDIATELY:   Following lower endoscopy (colonoscopy or flexible sigmoidoscopy):  Excessive amounts of blood in the stool  Significant tenderness or worsening of abdominal pains  Swelling of the abdomen that is new, acute  Fever of 100F or higher   For urgent or emergent issues, a gastroenterologist can be reached at any hour by calling [phone]  [phone].   DIET:  We do recommend a small meal at first, but then you may proceed to your regular diet.  Drink plenty of fluids but you should avoid alcoholic beverages for 24 hours.  ACTIVITY:  You should plan to take it easy for the rest of today and you should NOT DRIVE or use heavy machinery until tomorrow (because of the sedation medicines used during the test).    FOLLOW UP: Our staff will call the number listed on your records the next business day following your procedure to check on you and address any questions or concerns that you may have regarding the information given to you following your procedure. If we do not reach you, we will leave a message.  However, if you are feeling well and you are not experiencing any problems, there is no need to return our call.  We will assume that you have returned to your regular daily activities without incident.  If any biopsies were taken you will be contacted by phone or by letter within the next 1-3 weeks.  Please call us at [phone] if you have not heard about the biopsies in 3 weeks.    SIGNATURES/CONFIDENTIALITY: You and/or your care partner have signed paperwork which will be entered into your electronic medical record.  These signatures attest to the fact that that the information above on your After Visit Summary has been reviewed and is understood.  Full responsibility of  the confidentiality of this discharge information lies with you and/or your care-partner.   Resume medications. Information given on polyps and diverticulosis.

## 2017-04-29 ENCOUNTER — Telehealth: Payer: Self-pay | Admitting: *Deleted

## 2017-04-29 NOTE — Telephone Encounter (Signed)
  Follow up Call-  Call back number 04/28/2017  Post procedure Call Back phone  # [phone]  Permission to leave phone message Yes  Some recent data might be hidden     Patient questions:  Do you have a fever, pain , or abdominal swelling? No. Pain Score  0 *  Have you tolerated food without any problems? Yes.    Have you been able to return to your normal activities? Yes.    Do you have any questions about your discharge instructions: Diet   No. Medications  No. Follow up visit  No.  Do you have questions or concerns about your Care? No.  Actions: * If pain score is 4 or above: No action needed, pain <4.

## 2017-05-03 ENCOUNTER — Encounter: Payer: Self-pay | Admitting: Internal Medicine

## 2017-05-03 DIAGNOSIS — Z86010 Personal history of colonic polyps: Secondary | ICD-10-CM

## 2017-05-03 DIAGNOSIS — Z860101 Personal history of adenomatous and serrated colon polyps: Secondary | ICD-10-CM | POA: Insufficient documentation

## 2017-05-03 HISTORY — DX: Personal history of colonic polyps: Z86.010

## 2017-05-03 HISTORY — DX: Personal history of adenomatous and serrated colon polyps: Z86.0101

## 2017-06-16 DIAGNOSIS — E669 Obesity, unspecified: Secondary | ICD-10-CM | POA: Diagnosis not present

## 2017-06-16 DIAGNOSIS — J301 Allergic rhinitis due to pollen: Secondary | ICD-10-CM | POA: Diagnosis not present

## 2017-06-16 DIAGNOSIS — Z0000 Encounter for general adult medical examination without abnormal findings: Secondary | ICD-10-CM | POA: Diagnosis not present

## 2017-06-16 DIAGNOSIS — F321 Major depressive disorder, single episode, moderate: Secondary | ICD-10-CM | POA: Diagnosis not present

## 2017-08-16 DIAGNOSIS — Z23 Encounter for immunization: Secondary | ICD-10-CM | POA: Diagnosis not present

## 2017-09-25 DIAGNOSIS — M25562 Pain in left knee: Secondary | ICD-10-CM | POA: Diagnosis not present

## 2017-09-25 DIAGNOSIS — M25561 Pain in right knee: Secondary | ICD-10-CM | POA: Diagnosis not present

## 2017-09-25 DIAGNOSIS — M94261 Chondromalacia, right knee: Secondary | ICD-10-CM | POA: Diagnosis not present

## 2017-09-25 DIAGNOSIS — M25461 Effusion, right knee: Secondary | ICD-10-CM | POA: Diagnosis not present

## 2017-10-23 DIAGNOSIS — M25561 Pain in right knee: Secondary | ICD-10-CM | POA: Diagnosis not present

## 2017-10-23 DIAGNOSIS — M25461 Effusion, right knee: Secondary | ICD-10-CM | POA: Diagnosis not present

## 2017-12-26 ENCOUNTER — Ambulatory Visit: Payer: BLUE CROSS/BLUE SHIELD | Admitting: Family Medicine

## 2017-12-26 ENCOUNTER — Encounter: Payer: Self-pay | Admitting: Family Medicine

## 2017-12-26 VITALS — BP 126/80 | HR 83 | Temp 98.6°F | Ht 69.0 in | Wt 221.0 lb

## 2017-12-26 DIAGNOSIS — E559 Vitamin D deficiency, unspecified: Secondary | ICD-10-CM | POA: Insufficient documentation

## 2017-12-26 DIAGNOSIS — B9689 Other specified bacterial agents as the cause of diseases classified elsewhere: Secondary | ICD-10-CM | POA: Diagnosis not present

## 2017-12-26 DIAGNOSIS — J208 Acute bronchitis due to other specified organisms: Secondary | ICD-10-CM

## 2017-12-26 MED ORDER — AZITHROMYCIN 250 MG PO TABS: ORAL_TABLET | ORAL | 0 refills | Status: DC

## 2017-12-26 MED ORDER — BENZONATATE 100 MG PO CAPS: 100.0000 mg | ORAL_CAPSULE | Freq: Two times a day (BID) | ORAL | 0 refills | Status: DC | PRN

## 2017-12-26 MED ORDER — GUAIFENESIN-CODEINE 100-10 MG/5ML PO SOLN: 5.0000 mL | Freq: Four times a day (QID) | ORAL | 0 refills | Status: DC | PRN

## 2017-12-26 NOTE — Patient Instructions (Signed)
It was so good seeing you again! Thank you for establishing with my new practice and allowing me to continue caring for you. It means a lot to me.   Please schedule a follow up appointment with me in June 2019 for a complete physical, please come fasting.   Acute Bronchitis, Adult Acute bronchitis is sudden (acute) swelling of the air tubes (bronchi) in the lungs. Acute bronchitis causes these tubes to fill with mucus, which can make it hard to breathe. It can also cause coughing or wheezing. In adults, acute bronchitis usually goes away within 2 weeks. A cough caused by bronchitis may last up to 3 weeks. Smoking, allergies, and asthma can make the condition worse. Repeated episodes of bronchitis may cause further lung problems, such as chronic obstructive pulmonary disease (COPD). What are the causes? This condition can be caused by germs and by substances that irritate the lungs, including:  Cold and flu viruses. This condition is most often caused by the same virus that causes a cold.  Bacteria.  Exposure to tobacco smoke, dust, fumes, and air pollution.  What increases the risk? This condition is more likely to develop in people who:  Have close contact with someone with acute bronchitis.  Are exposed to lung irritants, such as tobacco smoke, dust, fumes, and vapors.  Have a weak immune system.  Have a respiratory condition such as asthma.  What are the signs or symptoms? Symptoms of this condition include:  A cough.  Coughing up clear, yellow, or green mucus.  Wheezing.  Chest congestion.  Shortness of breath.  A fever.  Body aches.  Chills.  A sore throat.  How is this diagnosed? This condition is usually diagnosed with a physical exam. During the exam, your health care provider may order tests, such as chest X-rays, to rule out other conditions. He or she may also:  Test a sample of your mucus for bacterial infection.  Check the level of oxygen in your  blood. This is done to check for pneumonia.  Do a chest X-ray or lung function testing to rule out pneumonia and other conditions.  Perform blood tests.  Your health care provider will also ask about your symptoms and medical history. How is this treated? Most cases of acute bronchitis clear up over time without treatment. Your health care provider may recommend:  Drinking more fluids. Drinking more makes your mucus thinner, which may make it easier to breathe.  Taking a medicine for a fever or cough.  Taking an antibiotic medicine.  Using an inhaler to help improve shortness of breath and to control a cough.  Using a cool mist vaporizer or humidifier to make it easier to breathe.  Follow these instructions at home: Medicines  Take over-the-counter and prescription medicines only as told by your health care provider.  If you were prescribed an antibiotic, take it as told by your health care provider. Do not stop taking the antibiotic even if you start to feel better. General instructions  Get plenty of rest.  Drink enough fluids to keep your urine clear or pale yellow.  Avoid smoking and secondhand smoke. Exposure to cigarette smoke or irritating chemicals will make bronchitis worse. If you smoke and you need help quitting, ask your health care provider. Quitting smoking will help your lungs heal faster.  Use an inhaler, cool mist vaporizer, or humidifier as told by your health care provider.  Keep all follow-up visits as told by your health care provider. This is  important. How is this prevented? To lower your risk of getting this condition again:  Wash your hands often with soap and water. If soap and water are not available, use hand sanitizer.  Avoid contact with people who have cold symptoms.  Try not to touch your hands to your mouth, nose, or eyes.  Make sure to get the flu shot every year.  Contact a health care provider if:  Your symptoms do not improve in 2  weeks of treatment. Get help right away if:  You cough up blood.  You have chest pain.  You have severe shortness of breath.  You become dehydrated.  You faint or keep feeling like you are going to faint.  You keep vomiting.  You have a severe headache.  Your fever or chills gets worse. This information is not intended to replace advice given to you by your health care provider. Make sure you discuss any questions you have with your health care provider. Document Released: 01/16/2005 Document Revised: 07/03/2016 Document Reviewed: 05/29/2016 Elsevier Interactive Patient Education  [employer].

## 2017-12-26 NOTE — Progress Notes (Signed)
Subjective  CC:  Chief Complaint  Patient presents with  . Cough    x 3 weeks, expectorating green sputum  . Fever    off and on highest 101    HPI: Deborah Stevens is a 52 y.o. female who presents to [facility] today to establish care with me as a new patient. She is a former [facility] patient and is here to reestablish care with me today.  Last cpe June 2018 She has the following concerns or needs:   Cough with productive sputum, spasms of cough x 3 weeks w/o sob or cp. + fevers initially, + bodyaches initially. No wheezing or h/o asthma/codp. Non-smoker. No GI sxs. No sick contacts but started during holiday travel to [place].   We updated and reviewed the patient's past history in detail and it is documented below.  No problems updated. Health Maintenance  Topic Date Due  . HIV Screening  08/18/1981  . TETANUS/TDAP  12/23/2018  . MAMMOGRAM  03/20/2019  . PAP SMEAR  03/19/2020  . COLONOSCOPY  04/28/2024  . INFLUENZA VACCINE  Completed   Immunization History  Administered Date(s) Administered  . Influenza-Unspecified 08/16/2017  . Tdap 12/23/2008   Current Meds  Medication Sig  . acetaminophen (TYLENOL) 500 MG tablet Take 1,000 mg by mouth every 6 (six) hours as needed for mild pain or moderate pain.  Marland Kitchen ibuprofen (ADVIL,MOTRIN) 200 MG tablet Take 400 mg by mouth every 6 (six) hours as needed for headache, mild pain or moderate pain.  Marland Kitchen loratadine (CLARITIN) 10 MG tablet Take 10 mg by mouth daily.  Marland Kitchen triamcinolone cream (KENALOG) 0.1 % APPLY TO AFFECTED AREA TWICE A DAY   Current Facility-Administered Medications for the 12/26/17 encounter (Office Visit) with Leamon Arnt, MD  Medication  . 0.9 %  sodium chloride infusion   Allergies: Patient has No Known Allergies.  Past Medical History Patient  has a past medical history of Anxiety, AR (allergic rhinitis) (08/17/2010), adenomatous polyp of colon (05/03/2017), Moderate major depression, single  episode ([place]) (07/15/2016), and Symptomatic cholelithiasis (04/08/2014). Past Surgical History Patient  has a past surgical history that includes Cholecystectomy (N/A, 04/29/2014); Laparoscopic lysis of adhesions (N/A, 04/29/2014); and Colonoscopy w/ polypectomy (04/2017). Family History: Patient family history is not on file. Social History:  Patient  reports that she quit smoking about 4 years ago. she has never used smokeless tobacco. She reports that she drinks alcohol. She reports that she does not use drugs.  Review of Systems: Constitutional: negative for fever or malaise Cardiovascular: negative for chest pain Respiratory: negative for SOB or persistent cough Gastrointestinal: negative for abdominal pain Genitourinary: negative for dysuria or gross hematuria Musculoskeletal: negative for new gait disturbance or muscular weakness Integumentary: negative for new or persistent rashes  Patient Care Team    Relationship Specialty Notifications Start End  Leamon Arnt, MD PCP - General Family Medicine  04/14/17   Fanny Bien, MD Attending Physician Emergency Medicine  08/19/13     Objective  Vitals: BP 126/80 (BP Location: Left Arm, Patient Position: Sitting, Cuff Size: Large)   Pulse 83   Temp 98.6 F (37 C) (Oral)   Ht 5\' 9"  (1.753 m)   Wt 221 lb (100.2 kg)   LMP 09/03/2017   SpO2 97%   BMI 32.64 kg/m  General:  Well developed, well nourished, no acute distress , forced dry cough Psych:  Alert and oriented,normal mood and affect HEENT:  Normocephalic, atraumatic, supple neck ,  red post pharynx, nasal congestion presetn Cardiovascular:  RRR without murmur Respiratory:  Good breath sounds bilaterally, CTAB with normal respiratory effort Gastrointestinal: normal bowel sounds, soft, non-tender, no noted masses. No HSM Skin:  Warm, no rashes Neurologic:   Mental status is normal. Gross motor and sensory exams are normal. Normal gait  Assessment  1. Acute bronchitis,  bacterial      Plan   Treat for acute bacterial bronchitis given duration of symptoms and history of fevers with worsening cough. antibiotics and cough medicines ordered.  Supportive care and follow-up for complete physical and next visit  Follow up: Return in about 5 months (around 05/26/2018) for your complete physical, please come fasting.   Commons side effects, risks, benefits, and alternatives for medications and treatment plan prescribed today were discussed, and the patient expressed understanding of the given instructions. Patient is instructed to call or message via MyChart if he/she has any questions or concerns regarding our treatment plan. No barriers to understanding were identified. We discussed Red Flag symptoms and signs in detail. Patient expressed understanding regarding what to do in case of urgent or emergency type symptoms.   Medication list was reconciled, printed and provided to the patient in AVS. Patient instructions and summary information was reviewed with the patient as documented in the AVS. This note was prepared with assistance of Dragon voice recognition software. Occasional wrong-word or sound-a-like substitutions may have occurred due to the inherent limitations of voice recognition software  No orders of the defined types were placed in this encounter.  Meds ordered this encounter  Medications  . guaiFENesin-codeine 100-10 MG/5ML syrup    Sig: Take 5 mLs by mouth every 6 (six) hours as needed for cough.    Dispense:  120 mL    Refill:  0  . benzonatate (TESSALON) 100 MG capsule    Sig: Take 1 capsule (100 mg total) by mouth 2 (two) times daily as needed for cough.    Dispense:  20 capsule    Refill:  0  . azithromycin (ZITHROMAX) 250 MG tablet    Sig: Take 2 tabs today, then 1 tab daily for 4 days    Dispense:  1 each    Refill:  0

## 2017-12-28 ENCOUNTER — Encounter: Payer: Self-pay | Admitting: Family Medicine

## 2018-05-13 DIAGNOSIS — H20012 Primary iridocyclitis, left eye: Secondary | ICD-10-CM | POA: Diagnosis not present

## 2018-05-21 DIAGNOSIS — H20012 Primary iridocyclitis, left eye: Secondary | ICD-10-CM | POA: Diagnosis not present

## 2018-06-01 ENCOUNTER — Other Ambulatory Visit: Payer: Self-pay

## 2018-06-01 ENCOUNTER — Encounter: Payer: Self-pay | Admitting: Emergency Medicine

## 2018-06-01 ENCOUNTER — Ambulatory Visit (INDEPENDENT_AMBULATORY_CARE_PROVIDER_SITE_OTHER): Payer: BLUE CROSS/BLUE SHIELD | Admitting: Family Medicine

## 2018-06-01 ENCOUNTER — Encounter: Payer: Self-pay | Admitting: Family Medicine

## 2018-06-01 VITALS — BP 122/88 | HR 82 | Temp 98.5°F | Ht 69.0 in | Wt 218.0 lb

## 2018-06-01 DIAGNOSIS — M94261 Chondromalacia, right knee: Secondary | ICD-10-CM

## 2018-06-01 DIAGNOSIS — Z1231 Encounter for screening mammogram for malignant neoplasm of breast: Secondary | ICD-10-CM | POA: Diagnosis not present

## 2018-06-01 DIAGNOSIS — Z0000 Encounter for general adult medical examination without abnormal findings: Secondary | ICD-10-CM

## 2018-06-01 DIAGNOSIS — Z1239 Encounter for other screening for malignant neoplasm of breast: Secondary | ICD-10-CM

## 2018-06-01 HISTORY — DX: Chondromalacia, right knee: M94.261

## 2018-06-01 LAB — LIPID PANEL
Cholesterol: 170 mg/dL (ref 0–200)
HDL: 60.4 mg/dL (ref >39.00)
LDL Cholesterol: 95 mg/dL (ref 0–99)
NonHDL: 109.48
Total CHOL/HDL Ratio: 3
Triglycerides: 74 mg/dL (ref 0.0–149.0)
VLDL: 14.8 mg/dL (ref 0.0–40.0)

## 2018-06-01 LAB — CBC WITH DIFFERENTIAL/PLATELET
Basophils Absolute: 0.1 10*3/uL (ref 0.0–0.1)
Basophils Relative: 1.2 % (ref 0.0–3.0)
Eosinophils Absolute: 0.1 10*3/uL (ref 0.0–0.7)
Eosinophils Relative: 2.6 % (ref 0.0–5.0)
HCT: 38.5 % (ref 36.0–46.0)
Hemoglobin: 13 g/dL (ref 12.0–15.0)
Lymphocytes Relative: 42.1 % (ref 12.0–46.0)
Lymphs Abs: 2.3 10*3/uL (ref 0.7–4.0)
MCHC: 33.8 g/dL (ref 30.0–36.0)
MCV: 90.3 fl (ref 78.0–100.0)
Monocytes Absolute: 0.4 10*3/uL (ref 0.1–1.0)
Monocytes Relative: 6.8 % (ref 3.0–12.0)
Neutro Abs: 2.6 10*3/uL (ref 1.4–7.7)
Neutrophils Relative %: 47.3 % (ref 43.0–77.0)
Platelets: 171 10*3/uL (ref 150.0–400.0)
RBC: 4.26 Mil/uL (ref 3.87–5.11)
RDW: 13.4 % (ref 11.5–15.5)
WBC: 5.5 10*3/uL (ref 4.0–10.5)

## 2018-06-01 LAB — COMPREHENSIVE METABOLIC PANEL
ALT: 17 U/L (ref 0–35)
AST: 21 U/L (ref 0–37)
Albumin: 4.2 g/dL (ref 3.5–5.2)
Alkaline Phosphatase: 79 U/L (ref 39–117)
BUN: 9 mg/dL (ref 6–23)
CO2: 29 mEq/L (ref 19–32)
Calcium: 9.4 mg/dL (ref 8.4–10.5)
Chloride: 104 mEq/L (ref 96–112)
Creatinine, Ser: 0.72 mg/dL (ref 0.40–1.20)
GFR: 109.49 mL/min (ref >60.00)
Glucose, Bld: 105 mg/dL — ABNORMAL HIGH (ref 70–99)
Potassium: 3.8 mEq/L (ref 3.5–5.1)
Sodium: 140 mEq/L (ref 135–145)
Total Bilirubin: 0.4 mg/dL (ref 0.2–1.2)
Total Protein: 7.1 g/dL (ref 6.0–8.3)

## 2018-06-01 NOTE — Patient Instructions (Signed)
Please return in 12 months for your annual complete physical; please come fasting.  Please go to see an orthopedic doctor to follow up on your knee pain.   I will release your lab results to you on your MyChart account with further instructions. Please reply with any questions.   We will call you with information regarding your referral appointment. Mammogram.  If you do not hear from [place] within the next 2 weeks, please let me know. It can take 1-2 weeks to get appointments set up with the specialists.   If you have any questions or concerns, please don't hesitate to send me a message via MyChart or call the office at [phone]. Thank you for visiting with [place] today! It's our pleasure caring for you.  Please do these things to maintain good health!   Exercise at least 30-45 minutes a day,  4-5 days a week.   Eat a low-fat diet with lots of fruits and vegetables, up to 7-9 servings per day.  Drink plenty of water daily. Try to drink 8 8oz glasses per day.  Seatbelts can save your life. Always wear your seatbelt.  Place Smoke Detectors on every level of your home and check batteries every year.  Schedule an appointment with an eye doctor for an eye exam every 1-2 years  Safe sex - use condoms to protect yourself from STDs if you could be exposed to these types of infections. Use birth control if you do not want to become pregnant and are sexually active.  Avoid heavy alcohol use. If you drink, keep it to less than 2 drinks/day and not every day.  [place].  Choose someone you trust that could speak for you if you became unable to speak for yourself.  Depression is common in our stressful world.If you're feeling down or losing interest in things you normally enjoy, please come in for a visit.  If anyone is threatening or hurting you, please get help. Physical or Emotional Violence is never OK.   Calcium Intake Recommendations You can take Caltrate Plus twice a  day or get it through your diet or other OTC supplements (Viactiv, OsCal etc)  Calcium is a mineral that affects many functions in the body, including: Blood clotting. Blood vessel function. Nerve impulse conduction. Hormone secretion. Muscle contraction. Bone and teeth functions.  Most of your body's calcium supply is stored in your bones and teeth. When your calcium stores are low, you may be at risk for low bone mass, bone loss, and bone fractures. Consuming enough calcium helps to grow healthy bones and teeth and to prevent breakdown over time. It is very important that you get enough calcium if you are: A child undergoing rapid growth. An adolescent girl. A pre- or post-menopausal woman. A woman whose menstrual cycle has stopped due to anorexia nervosa or regular intense exercise. An individual with lactose intolerance or a milk allergy. A vegetarian.  What is my plan? Try to consume the recommended amount of calcium daily based on your age. Depending on your overall health, your health care provider may recommend increased calcium intake.General daily calcium intake recommendations by age are: Birth to 6 months: 200 mg. Infants 7 to 12 months: 260 mg. Children 1 to 3 years: 700 mg. Children 4 to 8 years: 1,000 mg. Children 9 to 13 years: 1,300 mg. Teens 14 to 18 years: 1,300 mg. Adults 19 to 50 years: 1,000 mg. Adult women 51 to 70 years: 1,200 mg.  Adult men 51 to 70 years: 1,000 mg. Adults 71 years and older: 1,200 mg. Pregnant and breastfeeding teens: 1,300 mg. Pregnant and breastfeeding adults: 1,000 mg.  What do I need to know about calcium intake? In order for the body to absorb calcium, it needs vitamin D. You can get vitamin D through (we recommend getting [phone] units of Vitamin D daily) Direct exposure of the skin to sunlight. Foods, such as egg yolks, liver, saltwater fish, and fortified milk. Supplements. Consuming too much calcium may  cause: Constipation. Decreased absorption of iron and zinc. Kidney stones. Calcium supplements may interact with certain medicines. Check with your health care provider before starting any calcium supplements. Try to get most of your calcium from food. What foods can I eat? Grains  Fortified oatmeal. Fortified ready-to-eat cereals. Fortified frozen waffles. Vegetables Turnip greens. Broccoli. Fruits Fortified orange juice. Meats and Other Protein Sources Canned sardines with bones. Canned salmon with bones. Soy beans. Tofu. Baked beans. Almonds. [place] nuts. Sunflower seeds. Dairy Milk. Yogurt. Cheese. Cottage cheese. Beverages Fortified soy milk. Fortified rice milk. Sweets/Desserts Pudding. Ice Cream. Milkshakes. Blackstrap molasses. The items listed above may not be a complete list of recommended foods or beverages. Contact your dietitian for more options. What foods can affect my calcium intake? It may be more difficult for your body to use calcium or calcium may leave your body more quickly if you consume large amounts of: Sodium. Protein. Caffeine. Alcohol.  This information is not intended to replace advice given to you by your health care provider. Make sure you discuss any questions you have with your health care provider. Document Released: 07/23/2004 Document Revised: 06/28/2016 Document Reviewed: 05/17/2014 Elsevier Interactive Patient Education  2018 [employer].
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
## 2018-06-02 LAB — HIV ANTIBODY (ROUTINE TESTING W REFLEX): HIV 1&2 Ab, 4th Generation: NONREACTIVE

## 2018-07-02 ENCOUNTER — Ambulatory Visit
Admission: RE | Admit: 2018-07-02 | Discharge: 2018-07-02 | Disposition: A | Discharge status: Home / Self Care | Payer: BLUE CROSS/BLUE SHIELD | Source: Ambulatory Visit | Attending: Family Medicine | Admitting: Family Medicine

## 2018-07-02 DIAGNOSIS — Z1239 Encounter for other screening for malignant neoplasm of breast: Secondary | ICD-10-CM

## 2018-07-02 DIAGNOSIS — Z1231 Encounter for screening mammogram for malignant neoplasm of breast: Secondary | ICD-10-CM | POA: Diagnosis not present

## 2018-09-21 DIAGNOSIS — J101 Influenza due to other identified influenza virus with other respiratory manifestations: Secondary | ICD-10-CM | POA: Diagnosis not present

## 2018-09-21 DIAGNOSIS — R6889 Other general symptoms and signs: Secondary | ICD-10-CM | POA: Diagnosis not present

## 2018-09-21 DIAGNOSIS — R110 Nausea: Secondary | ICD-10-CM | POA: Diagnosis not present

## 2018-09-21 DIAGNOSIS — R05 Cough: Secondary | ICD-10-CM | POA: Diagnosis not present

## 2018-09-21 DIAGNOSIS — J209 Acute bronchitis, unspecified: Secondary | ICD-10-CM | POA: Diagnosis not present

## 2018-09-23 ENCOUNTER — Ambulatory Visit: Payer: Self-pay

## 2018-09-23 ENCOUNTER — Ambulatory Visit: Payer: Self-pay | Admitting: Family Medicine

## 2018-09-23 MED ORDER — GUAIFENESIN-CODEINE 100-10 MG/5ML PO SYRP: 5.0000 mL | ORAL_SOLUTION | ORAL | 0 refills | Status: DC | PRN

## 2018-09-23 MED ORDER — AMOXICILLIN-POT CLAVULANATE 875-125 MG PO TABS: 1.0000 | ORAL_TABLET | Freq: Two times a day (BID) | ORAL | 0 refills | Status: AC

## 2018-09-23 NOTE — Telephone Encounter (Signed)
Returned call to patient who called to report worsening symptoms of cough sore throat and body aches.  Pt was seen at urgent care early this week and confirmed to have influenza A and possible pneumonia. Pt reports temperature of 101.1 this AM. She is taking azthromycin since Monday with no relief.Pt is also using an inhaler. Pt states symptoms have worsen with pain now in her mid chest and back with cough that is severe. Appointment per protocol. Care advice read to patient.  Pt verbalized understanding of all. Reason for Disposition . Fever present > 3 days (72 hours)  Answer Assessment - Initial Assessment Questions 1. WORST SYMPTOM: "What is your worst symptom?" (e.g., cough, runny nose, muscle aches, headache, sore throat, fever)      Body ache. sore throat, fever 2. ONSET: "When did your flu symptoms start?"      Wednesday last week 3. COUGH: "How bad is the cough?"       severe 4. RESPIRATORY DISTRESS: "Describe your breathing."      SOB inhaler in use 5. FEVER: "Do you have a fever?" If so, ask: "What is your temperature, how was it measured, and when did it start?"     101.1 this am 6. EXPOSURE: "Were you exposed to someone with influenza?"        Swab influenza A 7. FLU VACCINE: "Did you get a flu shot this year?"     no 8. HIGH RISK DISEASE: "Do you any chronic medical problems?" (e.g., heart or lung disease, asthma, weak immune system, or other HIGH RISK conditions)     no 9. PREGNANCY: "Is there any chance you are pregnant?" "When was your last menstrual period?"     No  sproatic periods 10. OTHER SYMPTOMS: "Do you have any other symptoms?"  (e.g., runny nose, muscle aches, headache, sore throat)       Chest and back pain  Protocols used: INFLUENZA - SEASONAL-A-AH

## 2018-09-23 NOTE — Telephone Encounter (Signed)
I have sent medication to pharmacy.  I have spoken with the patient and she is aware of all instructions and stated verbal understanding.

## 2018-09-23 NOTE — Telephone Encounter (Signed)
Patient states that she has had symptoms since last Thursday (26th), she went to be seen on Monday at an Urgent Care on Monday (30th) and was diagnosed with the flu but told that she had the symptoms too long for tamiflu.  She states that she was told she possibly had pneumonia.   She has had a consistent fever (greater than 100 each day), cough (productive and green), and she was told by triage she probably needed an xray to determine the pneumonia.    Currently she is only taking the Azithromycin and they gave her brompheniramine- pseudoephedrine and  promethazine for cough, neither of which are working.   Patient can barely speak without coughing.   She states if there is something different that can be called in for her, she is willing to do that instead of coming into the office and exposing everyone, and she really does not feel like being out unless she has to.  Patient is aware that I am routing back to provider to advise and I will call her back.

## 2018-09-23 NOTE — Addendum Note (Signed)
Addended by: Katina Dung on: 09/23/2018 02:56 PM   Modules accepted: Orders

## 2018-09-23 NOTE — Telephone Encounter (Signed)
After much discussion, it seems unreasonable for pt to come to office as poorly as she is feeling.  Given the possibility of PNA in the setting of influenza, will broaden coverage by adding Augmentin 875/125mg  BID x10 days (#20) take w/ food.  For cough, we can add Cheratussin, 5 ml q4 prn cough, #160ml.  She should be alternating Tylenol and Ibuprofen every 4 hrs for body aches and feverIf no better by Monday or worsening over the rest of the week, she needs to be seen or go to ER.

## 2018-09-23 NOTE — Telephone Encounter (Signed)
If pt has confirmed flu, she will not be better in 2 days.  Is she on Tamiflu or just the Zpack?  Is there a particular symptom that has changed that requires a visit today?

## 2018-11-17 ENCOUNTER — Other Ambulatory Visit: Payer: Self-pay | Admitting: Physician Assistant

## 2018-11-17 ENCOUNTER — Ambulatory Visit (INDEPENDENT_AMBULATORY_CARE_PROVIDER_SITE_OTHER): Payer: BLUE CROSS/BLUE SHIELD | Admitting: Physician Assistant

## 2018-11-17 ENCOUNTER — Encounter: Payer: Self-pay | Admitting: Physician Assistant

## 2018-11-17 ENCOUNTER — Other Ambulatory Visit: Payer: Self-pay

## 2018-11-17 VITALS — BP 142/90 | HR 73 | Temp 98.2°F | Resp 16 | Ht 69.0 in | Wt 227.0 lb

## 2018-11-17 DIAGNOSIS — I10 Essential (primary) hypertension: Secondary | ICD-10-CM | POA: Diagnosis not present

## 2018-11-17 MED ORDER — HYDROCHLOROTHIAZIDE 12.5 MG PO TABS: 12.5000 mg | ORAL_TABLET | Freq: Every day | ORAL | 3 refills | Status: DC

## 2018-11-17 NOTE — Patient Instructions (Addendum)
Please keep hydrated and get plenty of rest.  Slow down on the [employer].  Follow the dietary recommendations below to limit salt.  Use tylenol if needed for headache instead of Aleve as it can contribute to raised BP. We should see headache resolve with improvement in BP. Get a home BP cuff to check BP measurements at home, after you have been sitting for 10 minutes. The kiosks at [facility] can be inaccurate.  Check BP daily and record.  Follow-up in 7 days with Dr. Jonni Sanger for repeat assessment.  If still getting elevated BP, will start medication.   DASH Eating Plan DASH stands for "Dietary Approaches to Stop Hypertension." The DASH eating plan is a healthy eating plan that has been shown to reduce high blood pressure (hypertension). It may also reduce your risk for type 2 diabetes, heart disease, and stroke. The DASH eating plan may also help with weight loss. What are tips for following this plan? General guidelines  Avoid eating more than 2,300 mg (milligrams) of salt (sodium) a day. If you have hypertension, you may need to reduce your sodium intake to 1,500 mg a day.  Limit alcohol intake to no more than 1 drink a day for nonpregnant women and 2 drinks a day for men. One drink equals 12 oz of beer, 5 oz of wine, or 1 oz of hard liquor.  Work with your health care provider to maintain a healthy body weight or to lose weight. Ask what an ideal weight is for you.  Get at least 30 minutes of exercise that causes your heart to beat faster (aerobic exercise) most days of the week. Activities may include walking, swimming, or biking.  Work with your health care provider or diet and nutrition specialist (dietitian) to adjust your eating plan to your individual calorie needs. Reading food labels  Check food labels for the amount of sodium per serving. Choose foods with less than 5 percent of the Daily Value of sodium. Generally, foods with less than 300 mg of sodium per serving fit into this  eating plan.  To find whole grains, look for the word "whole" as the first word in the ingredient list. Shopping  Buy products labeled as "low-sodium" or "no salt added."  Buy fresh foods. Avoid canned foods and premade or frozen meals. Cooking  Avoid adding salt when cooking. Use salt-free seasonings or herbs instead of table salt or sea salt. Check with your health care provider or pharmacist before using salt substitutes.  Do not fry foods. Cook foods using healthy methods such as baking, boiling, grilling, and broiling instead.  Cook with heart-healthy oils, such as olive, canola, soybean, or sunflower oil. Meal planning   Eat a balanced diet that includes: ? 5 or more servings of fruits and vegetables each day. At each meal, try to fill half of your plate with fruits and vegetables. ? Up to 6-8 servings of whole grains each day. ? Less than 6 oz of lean meat, poultry, or fish each day. A 3-oz serving of meat is about the same size as a deck of cards. One egg equals 1 oz. ? 2 servings of low-fat dairy each day. ? A serving of nuts, seeds, or beans 5 times each week. ? Heart-healthy fats. Healthy fats called Omega-3 fatty acids are found in foods such as flaxseeds and coldwater fish, like sardines, salmon, and mackerel.  Limit how much you eat of the following: ? Canned or prepackaged foods. ? Food that is high  in trans fat, such as fried foods. ? Food that is high in saturated fat, such as fatty meat. ? Sweets, desserts, sugary drinks, and other foods with added sugar. ? Full-fat dairy products.  Do not salt foods before eating.  Try to eat at least 2 vegetarian meals each week.  Eat more home-cooked food and less restaurant, buffet, and fast food.  When eating at a restaurant, ask that your food be prepared with less salt or no salt, if possible. What foods are recommended? The items listed may not be a complete list. Talk with your dietitian about what dietary choices  are best for you. Grains Whole-grain or whole-wheat bread. Whole-grain or whole-wheat pasta. Brown rice. [place]. Bulgur. Whole-grain and low-sodium cereals. Pita bread. Low-fat, low-sodium crackers. Whole-wheat flour tortillas. Vegetables Fresh or frozen vegetables (raw, steamed, roasted, or grilled). Low-sodium or reduced-sodium tomato and vegetable juice. Low-sodium or reduced-sodium tomato sauce and tomato paste. Low-sodium or reduced-sodium canned vegetables. Fruits All fresh, dried, or frozen fruit. Canned fruit in natural juice (without added sugar). Meat and other protein foods Skinless chicken or [place]. Ground chicken or [place]. Pork with fat trimmed off. Fish and seafood. Egg whites. Dried beans, peas, or lentils. Unsalted nuts, nut butters, and seeds. Unsalted canned beans. Lean cuts of beef with fat trimmed off. Low-sodium, lean deli meat. Dairy Low-fat (1%) or fat-free (skim) milk. Fat-free, low-fat, or reduced-fat cheeses. Nonfat, low-sodium ricotta or cottage cheese. Low-fat or nonfat yogurt. Low-fat, low-sodium cheese. Fats and oils Soft margarine without trans fats. Vegetable oil. Low-fat, reduced-fat, or light mayonnaise and salad dressings (reduced-sodium). Canola, safflower, olive, soybean, and sunflower oils. Avocado. Seasoning and other foods Herbs. Spices. Seasoning mixes without salt. Unsalted popcorn and pretzels. Fat-free sweets. What foods are not recommended? The items listed may not be a complete list. Talk with your dietitian about what dietary choices are best for you. Grains Baked goods made with fat, such as croissants, muffins, or some breads. Dry pasta or rice meal packs. Vegetables Creamed or fried vegetables. Vegetables in a cheese sauce. Regular canned vegetables (not low-sodium or reduced-sodium). Regular canned tomato sauce and paste (not low-sodium or reduced-sodium). Regular tomato and vegetable juice (not low-sodium or reduced-sodium). Angie Fava.  Olives. Fruits Canned fruit in a light or heavy syrup. Fried fruit. Fruit in cream or butter sauce. Meat and other protein foods Fatty cuts of meat. Ribs. Fried meat. Berniece Salines. Sausage. Bologna and other processed lunch meats. Salami. Fatback. Hotdogs. Bratwurst. Salted nuts and seeds. Canned beans with added salt. Canned or smoked fish. Whole eggs or egg yolks. Chicken or [place] with skin. Dairy Whole or 2% milk, cream, and half-and-half. Whole or full-fat cream cheese. Whole-fat or sweetened yogurt. Full-fat cheese. Nondairy creamers. Whipped toppings. Processed cheese and cheese spreads. Fats and oils Butter. Stick margarine. Lard. Shortening. Ghee. Bacon fat. Tropical oils, such as coconut, palm kernel, or palm oil. Seasoning and other foods Salted popcorn and pretzels. Onion salt, garlic salt, seasoned salt, table salt, and sea salt. Worcestershire sauce. Tartar sauce. Barbecue sauce. Teriyaki sauce. Soy sauce, including reduced-sodium. Steak sauce. Canned and packaged gravies. Fish sauce. Oyster sauce. Cocktail sauce. Horseradish that you find on the shelf. Ketchup. Mustard. Meat flavorings and tenderizers. Bouillon cubes. Hot sauce and Tabasco sauce. Premade or packaged marinades. Premade or packaged taco seasonings. Relishes. Regular salad dressings. Where to find more information:  National Heart, Lung, and [place]: https://wilson-eaton.com/  American Heart Association: www.heart.org Summary  The DASH eating plan is a healthy eating plan  that has been shown to reduce high blood pressure (hypertension). It may also reduce your risk for type 2 diabetes, heart disease, and stroke.  With the DASH eating plan, you should limit salt (sodium) intake to 2,300 mg a day. If you have hypertension, you may need to reduce your sodium intake to 1,500 mg a day.  When on the DASH eating plan, aim to eat more fresh fruits and vegetables, whole grains, lean proteins, low-fat dairy, and heart-healthy  fats.  Work with your health care provider or diet and nutrition specialist (dietitian) to adjust your eating plan to your individual calorie needs. This information is not intended to replace advice given to you by your health care provider. Make sure you discuss any questions you have with your health care provider. Document Released: 11/28/2011 Document Revised: 12/02/2016 Document Reviewed: 12/02/2016 Elsevier Interactive Patient Education  [employer].

## 2018-11-17 NOTE — Progress Notes (Signed)
\  Patient presents to clinic today c/o elevated BP readings since October with associated headache and occasional blurry vision when BP is high. Denies history of hypertension. Patient denies chest pain, palpitations, lightheadedness, dizziness. Notes she has been anxious about her BP levels so wanted to get things checked out. Does note she drinks a lot of soda per day and eats quite a bit of salted pistachios daily. Otherwise diet is good per patient.   Past Medical History:  Diagnosis Date  . Anxiety   . AR (allergic rhinitis) 08/17/2010  . 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 Michaelle Birks 2018  . Hx of adenomatous polyp of colon 05/03/2017  . Moderate major depression, single episode ([place]) 07/15/2016  . Symptomatic cholelithiasis 04/08/2014    No current outpatient medications on file prior to visit.   No current facility-administered medications on file prior to visit.     No Known Allergies  Family History  Problem Relation Age of Onset  . Colon cancer Neg Hx     Social History   Socioeconomic History  . Marital status: Married    Spouse name: Not on file  . Number of children: Not on file  . Years of education: Not on file  . Highest education level: Not on file  Occupational History  . Not on file  Social Needs  . Financial resource strain: Not on file  . Food insecurity:    Worry: Not on file    Inability: Not on file  . Transportation needs:    Medical: Not on file    Non-medical: Not on file  Tobacco Use  . Smoking status: Former Smoker    Last attempt to quit: 04/08/2013    Years since quitting: 5.6  . Smokeless tobacco: Never Used  Substance and Sexual Activity  . Alcohol use: Yes    Comment: occasional  . Drug use: No  . Sexual activity: Not on file  Lifestyle  . Physical activity:    Days per week: Not on file    Minutes per session: Not on file  . Stress: Not on file  Relationships  . Social connections:    Talks on phone: Not on file   Gets together: Not on file    Attends religious service: Not on file    Active member of club or organization: Not on file    Attends meetings of clubs or organizations: Not on file    Relationship status: Not on file  Other Topics Concern  . Not on file  Social History Narrative  . Not on file   Review of Systems - See HPI.  All other ROS are negative.  BP (!) 142/90   Pulse 73   Temp 98.2 F (36.8 C) (Oral)   Resp 16   Ht 5\' 9"  (1.753 m)   Wt 227 lb (103 kg)   SpO2 99%   BMI 33.52 kg/m   Physical Exam  Constitutional: She is oriented to person, place, and time. She appears well-developed and well-nourished.  HENT:  Head: Normocephalic and atraumatic.  Right Ear: External ear normal.  Left Ear: External ear normal.  Mouth/Throat: Oropharynx is clear and moist.  Eyes: Conjunctivae are normal.  Neck: Neck supple.  Cardiovascular: Normal rate, regular rhythm, normal heart sounds and intact distal pulses.  Pulmonary/Chest: Effort normal and breath sounds normal. No stridor. No respiratory distress. She has no wheezes. She has no rales. She exhibits no tenderness.  Neurological: She is alert and oriented to  person, place, and time.  Vitals reviewed.  Assessment/Plan: 1. Essential hypertension EKG with NSR today. BP improved slightly on recheck. Giving headache will start RX in addition to diet and lifesdtyle interventions. Rx HCTZ 12.5 mg daily. DASH diet handout given. Stress relief tactics reviewed. She is to get BP cuff and check BP daily after taking medication. Close follow-up scheduled with PCP. - EKG 12-Lead   Leeanne Rio, PA-C

## 2019-02-09 ENCOUNTER — Other Ambulatory Visit: Payer: Self-pay | Admitting: Physician Assistant

## 2019-03-12 ENCOUNTER — Encounter: Payer: Self-pay | Admitting: Family Medicine

## 2019-03-17 DIAGNOSIS — R509 Fever, unspecified: Secondary | ICD-10-CM | POA: Diagnosis not present

## 2019-03-17 DIAGNOSIS — R05 Cough: Secondary | ICD-10-CM | POA: Diagnosis not present

## 2019-03-17 DIAGNOSIS — Z03818 Encounter for observation for suspected exposure to other biological agents ruled out: Secondary | ICD-10-CM | POA: Diagnosis not present

## 2019-04-15 ENCOUNTER — Ambulatory Visit (INDEPENDENT_AMBULATORY_CARE_PROVIDER_SITE_OTHER): Payer: BLUE CROSS/BLUE SHIELD | Admitting: Family Medicine

## 2019-04-15 ENCOUNTER — Encounter: Payer: Self-pay | Admitting: Family Medicine

## 2019-04-15 ENCOUNTER — Other Ambulatory Visit: Payer: Self-pay

## 2019-04-15 ENCOUNTER — Telehealth: Payer: Self-pay | Admitting: Family Medicine

## 2019-04-15 VITALS — BP 130/90 | HR 96 | Temp 98.9°F | Wt 220.0 lb

## 2019-04-15 DIAGNOSIS — J301 Allergic rhinitis due to pollen: Secondary | ICD-10-CM

## 2019-04-15 DIAGNOSIS — E669 Obesity, unspecified: Secondary | ICD-10-CM

## 2019-04-15 DIAGNOSIS — L239 Allergic contact dermatitis, unspecified cause: Secondary | ICD-10-CM | POA: Diagnosis not present

## 2019-04-15 DIAGNOSIS — I10 Essential (primary) hypertension: Secondary | ICD-10-CM

## 2019-04-15 MED ORDER — HYDROCHLOROTHIAZIDE 25 MG PO TABS: 25.0000 mg | ORAL_TABLET | Freq: Every day | ORAL | 1 refills | Status: DC

## 2019-04-15 MED ORDER — TRIAMCINOLONE ACETONIDE 0.1 % EX CREA: TOPICAL_CREAM | Freq: Two times a day (BID) | CUTANEOUS | 2 refills | Status: AC

## 2019-04-15 MED ORDER — FLUTICASONE PROPIONATE 50 MCG/ACT NA SUSP: 2.0000 | Freq: Every day | NASAL | 6 refills | Status: DC

## 2019-04-15 MED ORDER — OLOPATADINE HCL 0.1 % OP SOLN: 1.0000 [drp] | Freq: Two times a day (BID) | OPHTHALMIC | 2 refills | Status: DC

## 2019-04-15 NOTE — Patient Instructions (Addendum)
Please return in June 2020 for your complete physical.  06/03/2019  If you have any questions or concerns, please don't hesitate to send me a message via MyChart or call the office at [phone]. Thank you for visiting with [place] today! It's our pleasure caring for you.   Hypertension Hypertension, commonly called high blood pressure, is when the force of blood pumping through the arteries is too strong. The arteries are the blood vessels that carry blood from the heart throughout the body. Hypertension forces the heart to work harder to pump blood and may cause arteries to become narrow or stiff. Having untreated or uncontrolled hypertension can cause heart attacks, strokes, kidney disease, and other problems. A blood pressure reading consists of a higher number over a lower number. Ideally, your blood pressure should be below 120/80. The first ("top") number is called the systolic pressure. It is a measure of the pressure in your arteries as your heart beats. The second ("bottom") number is called the diastolic pressure. It is a measure of the pressure in your arteries as the heart relaxes. What are the causes? The cause of this condition is not known. What increases the risk? Some risk factors for high blood pressure are under your control. Others are not. Factors you can change  Smoking.  Having type 2 diabetes mellitus, high cholesterol, or both.  Not getting enough exercise or physical activity.  Being overweight.  Having too much fat, sugar, calories, or salt (sodium) in your diet.  Drinking too much alcohol. Factors that are difficult or impossible to change  Having chronic kidney disease.  Having a family history of high blood pressure.  Age. Risk increases with age.  Race. You may be at higher risk if you are African-American.  Gender. Men are at higher risk than women before age 49. After age 67, women are at higher risk than men.  Having obstructive sleep  apnea.  Stress. What are the signs or symptoms? Extremely high blood pressure (hypertensive crisis) may cause:  Headache.  Anxiety.  Shortness of breath.  Nosebleed.  Nausea and vomiting.  Severe chest pain.  Jerky movements you cannot control (seizures). How is this diagnosed? This condition is diagnosed by measuring your blood pressure while you are seated, with your arm resting on a surface. The cuff of the blood pressure monitor will be placed directly against the skin of your upper arm at the level of your heart. It should be measured at least twice using the same arm. Certain conditions can cause a difference in blood pressure between your right and left arms. Certain factors can cause blood pressure readings to be lower or higher than normal (elevated) for a short period of time:  When your blood pressure is higher when you are in a health care provider's office than when you are at home, this is called white coat hypertension. Most people with this condition do not need medicines.  When your blood pressure is higher at home than when you are in a health care provider's office, this is called masked hypertension. Most people with this condition may need medicines to control blood pressure. If you have a high blood pressure reading during one visit or you have normal blood pressure with other risk factors:  You may be asked to return on a different day to have your blood pressure checked again.  You may be asked to monitor your blood pressure at home for 1 week or longer. If you are diagnosed with hypertension,  you may have other blood or imaging tests to help your health care provider understand your overall risk for other conditions. How is this treated? This condition is treated by making healthy lifestyle changes, such as eating healthy foods, exercising more, and reducing your alcohol intake. Your health care provider may prescribe medicine if lifestyle changes are not  enough to get your blood pressure under control, and if:  Your systolic blood pressure is above 130.  Your diastolic blood pressure is above 80. Your personal target blood pressure may vary depending on your medical conditions, your age, and other factors. Follow these instructions at home: Eating and drinking   Eat a diet that is high in fiber and potassium, and low in sodium, added sugar, and fat. An example eating plan is called the DASH (Dietary Approaches to Stop Hypertension) diet. To eat this way: ? Eat plenty of fresh fruits and vegetables. Try to fill half of your plate at each meal with fruits and vegetables. ? Eat whole grains, such as whole wheat pasta, brown rice, or whole grain bread. Fill about one quarter of your plate with whole grains. ? Eat or drink low-fat dairy products, such as skim milk or low-fat yogurt. ? Avoid fatty cuts of meat, processed or cured meats, and poultry with skin. Fill about one quarter of your plate with lean proteins, such as fish, chicken without skin, beans, eggs, and tofu. ? Avoid premade and processed foods. These tend to be higher in sodium, added sugar, and fat.  Reduce your daily sodium intake. Most people with hypertension should eat less than 1,500 mg of sodium a day.  Limit alcohol intake to no more than 1 drink a day for nonpregnant women and 2 drinks a day for men. One drink equals 12 oz of beer, 5 oz of wine, or 1 oz of hard liquor. Lifestyle   Work with your health care provider to maintain a healthy body weight or to lose weight. Ask what an ideal weight is for you.  Get at least 30 minutes of exercise that causes your heart to beat faster (aerobic exercise) most days of the week. Activities may include walking, swimming, or biking.  Include exercise to strengthen your muscles (resistance exercise), such as pilates or lifting weights, as part of your weekly exercise routine. Try to do these types of exercises for 30 minutes at least  3 days a week.  Do not use any products that contain nicotine or tobacco, such as cigarettes and e-cigarettes. If you need help quitting, ask your health care provider.  Monitor your blood pressure at home as told by your health care provider.  Keep all follow-up visits as told by your health care provider. This is important. Medicines  Take over-the-counter and prescription medicines only as told by your health care provider. Follow directions carefully. Blood pressure medicines must be taken as prescribed.  Do not skip doses of blood pressure medicine. Doing this puts you at risk for problems and can make the medicine less effective.  Ask your health care provider about side effects or reactions to medicines that you should watch for. Contact a health care provider if:  You think you are having a reaction to a medicine you are taking.  You have headaches that keep coming back (recurring).  You feel dizzy.  You have swelling in your ankles.  You have trouble with your vision. Get help right away if:  You develop a severe headache or confusion.  You have  unusual weakness or numbness.  You feel faint.  You have severe pain in your chest or abdomen.  You vomit repeatedly.  You have trouble breathing. Summary  Hypertension is when the force of blood pumping through your arteries is too strong. If this condition is not controlled, it may put you at risk for serious complications.  Your personal target blood pressure may vary depending on your medical conditions, your age, and other factors. For most people, a normal blood pressure is less than 120/80.  Hypertension is treated with lifestyle changes, medicines, or a combination of both. Lifestyle changes include weight loss, eating a healthy, low-sodium diet, exercising more, and limiting alcohol. This information is not intended to replace advice given to you by your health care provider. Make sure you discuss any questions  you have with your health care provider. Document Released: 12/09/2005 Document Revised: 11/06/2016 Document Reviewed: 11/06/2016 Elsevier Interactive Patient Education  2019 [employer].
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
## 2019-04-15 NOTE — Telephone Encounter (Signed)
Copied from [place] [id number]. Topic: Appointment Scheduling - Scheduling Inquiry for Clinic >> Apr 15, 2019  8:58 AM Scherrie Gerlach wrote: Reason for CRM: pt calling for virtual visit concerning allergies.  There is a message in mychart frm Dr Jonni Sanger to call and make appt. I tried the office >6 times and the line continues to be busy. Pt states please call her on the work number listed below.  Thank you!

## 2019-04-15 NOTE — Telephone Encounter (Signed)
Can you can pt to schedule plz. I am starting another doxy call

## 2019-06-03 ENCOUNTER — Ambulatory Visit (INDEPENDENT_AMBULATORY_CARE_PROVIDER_SITE_OTHER): Payer: BC Managed Care – PPO | Admitting: Family Medicine

## 2019-06-03 ENCOUNTER — Other Ambulatory Visit: Payer: Self-pay

## 2019-06-03 ENCOUNTER — Encounter: Payer: Self-pay | Admitting: Family Medicine

## 2019-06-03 VITALS — BP 102/64 | HR 72 | Temp 98.4°F | Resp 16 | Ht 69.0 in | Wt 219.8 lb

## 2019-06-03 DIAGNOSIS — I10 Essential (primary) hypertension: Secondary | ICD-10-CM | POA: Diagnosis not present

## 2019-06-03 DIAGNOSIS — Z8249 Family history of ischemic heart disease and other diseases of the circulatory system: Secondary | ICD-10-CM

## 2019-06-03 DIAGNOSIS — E669 Obesity, unspecified: Secondary | ICD-10-CM

## 2019-06-03 DIAGNOSIS — Z0000 Encounter for general adult medical examination without abnormal findings: Secondary | ICD-10-CM

## 2019-06-03 DIAGNOSIS — Z1239 Encounter for other screening for malignant neoplasm of breast: Secondary | ICD-10-CM

## 2019-06-03 LAB — CBC WITH DIFFERENTIAL/PLATELET
Basophils Absolute: 0.1 10*3/uL (ref 0.0–0.1)
Basophils Relative: 1.3 % (ref 0.0–3.0)
Eosinophils Absolute: 0.1 10*3/uL (ref 0.0–0.7)
Eosinophils Relative: 1.6 % (ref 0.0–5.0)
HCT: 39.8 % (ref 36.0–46.0)
Hemoglobin: 13.4 g/dL (ref 12.0–15.0)
Lymphocytes Relative: 35.5 % (ref 12.0–46.0)
Lymphs Abs: 2.3 10*3/uL (ref 0.7–4.0)
MCHC: 33.8 g/dL (ref 30.0–36.0)
MCV: 89.9 fl (ref 78.0–100.0)
Monocytes Absolute: 0.4 10*3/uL (ref 0.1–1.0)
Monocytes Relative: 5.5 % (ref 3.0–12.0)
Neutro Abs: 3.6 10*3/uL (ref 1.4–7.7)
Neutrophils Relative %: 56.1 % (ref 43.0–77.0)
Platelets: 159 10*3/uL (ref 150.0–400.0)
RBC: 4.43 Mil/uL (ref 3.87–5.11)
RDW: 14 % (ref 11.5–15.5)
WBC: 6.4 10*3/uL (ref 4.0–10.5)

## 2019-06-03 LAB — COMPREHENSIVE METABOLIC PANEL
ALT: 15 U/L (ref 0–35)
AST: 18 U/L (ref 0–37)
Albumin: 4 g/dL (ref 3.5–5.2)
Alkaline Phosphatase: 77 U/L (ref 39–117)
BUN: 10 mg/dL (ref 6–23)
CO2: 32 mEq/L (ref 19–32)
Calcium: 9.1 mg/dL (ref 8.4–10.5)
Chloride: 98 mEq/L (ref 96–112)
Creatinine, Ser: 0.78 mg/dL (ref 0.40–1.20)
GFR: 93.56 mL/min (ref >60.00)
Glucose, Bld: 92 mg/dL (ref 70–99)
Potassium: 3.4 mEq/L — ABNORMAL LOW (ref 3.5–5.1)
Sodium: 138 mEq/L (ref 135–145)
Total Bilirubin: 0.4 mg/dL (ref 0.2–1.2)
Total Protein: 6.8 g/dL (ref 6.0–8.3)

## 2019-06-03 LAB — POCT URINALYSIS DIPSTICK
Bilirubin, UA: NEGATIVE
Blood, UA: NEGATIVE
Glucose, UA: NEGATIVE
Ketones, UA: NEGATIVE
Leukocytes, UA: NEGATIVE
Nitrite, UA: NEGATIVE
Protein, UA: NEGATIVE
Spec Grav, UA: 1.025 (ref 1.010–1.025)
Urobilinogen, UA: 1 E.U./dL
pH, UA: 6 (ref 5.0–8.0)

## 2019-06-03 LAB — LIPID PANEL
Cholesterol: 175 mg/dL (ref 0–200)
HDL: 52.7 mg/dL (ref >39.00)
LDL Cholesterol: 100 mg/dL — ABNORMAL HIGH (ref 0–99)
NonHDL: 122.11
Total CHOL/HDL Ratio: 3
Triglycerides: 109 mg/dL (ref 0.0–149.0)
VLDL: 21.8 mg/dL (ref 0.0–40.0)

## 2019-06-03 LAB — TSH: TSH: 1.12 u[IU]/mL (ref 0.35–4.50)

## 2019-06-03 MED ORDER — HYDROCHLOROTHIAZIDE 25 MG PO TABS: 12.5000 mg | ORAL_TABLET | Freq: Every day | ORAL | 1 refills | Status: DC
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
## 2019-06-03 NOTE — Patient Instructions (Signed)
Please return in 6 months for follow up of your hypertension.  Please cut your BP pill in half.   If you have any questions or concerns, please don't hesitate to send me a message via MyChart or call the office at [phone]. Thank you for visiting with Deborah Stevens today! It's our pleasure caring for you.   Preventive Care 40-64 Years, Female Preventive care refers to lifestyle choices and visits with your health care provider that can promote health and wellness. What does preventive care include?   A yearly physical exam. This is also called an annual well check.  Dental exams once or twice a year.  Routine eye exams. Ask your health care provider how often you should have your eyes checked.  Personal lifestyle choices, including: ? Daily care of your teeth and gums. ? Regular physical activity. ? Eating a healthy diet. ? Avoiding tobacco and drug use. ? Limiting alcohol use. ? Practicing safe sex. ? Taking low-dose aspirin daily starting at age 61. ? Taking vitamin and mineral supplements as recommended by your health care provider. What happens during an annual well check? The services and screenings done by your health care provider during your annual well check will depend on your age, overall health, lifestyle risk factors, and family history of disease. Counseling Your health care provider may ask you questions about your:  Alcohol use.  Tobacco use.  Drug use.  Emotional well-being.  Home and relationship well-being.  Sexual activity.  Eating habits.  Work and work Statistician.  Method of birth control.  Menstrual cycle.  Pregnancy history. Screening You may have the following tests or measurements:  Height, weight, and BMI.  Blood pressure.  Lipid and cholesterol levels. These may be checked every 5 years, or more frequently if you are over 79 years old.  Skin check.  Lung cancer screening. You may have this screening every year starting at age 58 if  you have a 30-pack-year history of smoking and currently smoke or have quit within the past 15 years.  Colorectal cancer screening. All adults should have this screening starting at age 46 and continuing until age 27. Your health care provider may recommend screening at age 53. You will have tests every 1-10 years, depending on your results and the type of screening test. People at increased risk should start screening at an earlier age. Screening tests may include: ? Guaiac-based fecal occult blood testing. ? Fecal immunochemical test (FIT). ? Stool DNA test. ? Virtual colonoscopy. ? Sigmoidoscopy. During this test, a flexible tube with a tiny camera (sigmoidoscope) is used to examine your rectum and lower colon. The sigmoidoscope is inserted through your anus into your rectum and lower colon. ? Colonoscopy. During this test, a long, thin, flexible tube with a tiny camera (colonoscope) is used to examine your entire colon and rectum.  Hepatitis C blood test.  Hepatitis B blood test.  Sexually transmitted disease (STD) testing.  Diabetes screening. This is done by checking your blood sugar (glucose) after you have not eaten for a while (fasting). You may have this done every 1-3 years.  Mammogram. This may be done every 1-2 years. Talk to your health care provider about when you should start having regular mammograms. This may depend on whether you have a family history of breast cancer.  BRCA-related cancer screening. This may be done if you have a family history of breast, ovarian, tubal, or peritoneal cancers.  Pelvic exam and Pap test. This may be done every  3 years starting at age 32. Starting at age 85, this may be done every 5 years if you have a Pap test in combination with an HPV test.  Bone density scan. This is done to screen for osteoporosis. You may have this scan if you are at high risk for osteoporosis. Discuss your test results, treatment options, and if necessary, the need  for more tests with your health care provider. Vaccines Your health care provider may recommend certain vaccines, such as:  Influenza vaccine. This is recommended every year.  Tetanus, diphtheria, and acellular pertussis (Tdap, Td) vaccine. You may need a Td booster every 10 years.  Varicella vaccine. You may need this if you have not been vaccinated.  Zoster vaccine. You may need this after age 6.  Measles, mumps, and rubella (MMR) vaccine. You may need at least one dose of MMR if you were born in 1957 or later. You may also need a second dose.  Pneumococcal 13-valent conjugate (PCV13) vaccine. You may need this if you have certain conditions and were not previously vaccinated.  Pneumococcal polysaccharide (PPSV23) vaccine. You may need one or two doses if you smoke cigarettes or if you have certain conditions.  Meningococcal vaccine. You may need this if you have certain conditions.  Hepatitis A vaccine. You may need this if you have certain conditions or if you travel or work in places where you may be exposed to hepatitis A.  Hepatitis B vaccine. You may need this if you have certain conditions or if you travel or work in places where you may be exposed to hepatitis B.  Haemophilus influenzae type b (Hib) vaccine. You may need this if you have certain conditions. Talk to your health care provider about which screenings and vaccines you need and how often you need them. This information is not intended to replace advice given to you by your health care provider. Make sure you discuss any questions you have with your health care provider. Document Released: 01/05/2016 Document Revised: 01/29/2018 Document Reviewed: 10/10/2015 Elsevier Interactive Patient Education  2019 [employer].

## 2019-06-19 ENCOUNTER — Encounter (HOSPITAL_COMMUNITY): Payer: Self-pay | Admitting: Emergency Medicine

## 2019-06-19 ENCOUNTER — Emergency Department (HOSPITAL_COMMUNITY)
Admission: EM | Admit: 2019-06-19 | Discharge: 2019-06-19 | Disposition: A | Discharge status: Home / Self Care | Payer: BC Managed Care – PPO | Attending: Emergency Medicine | Admitting: Emergency Medicine

## 2019-06-19 ENCOUNTER — Other Ambulatory Visit: Payer: Self-pay

## 2019-06-19 ENCOUNTER — Emergency Department (HOSPITAL_COMMUNITY): Payer: BC Managed Care – PPO

## 2019-06-19 DIAGNOSIS — D259 Leiomyoma of uterus, unspecified: Secondary | ICD-10-CM | POA: Insufficient documentation

## 2019-06-19 DIAGNOSIS — Z87891 Personal history of nicotine dependence: Secondary | ICD-10-CM | POA: Diagnosis not present

## 2019-06-19 DIAGNOSIS — R1084 Generalized abdominal pain: Secondary | ICD-10-CM | POA: Diagnosis not present

## 2019-06-19 DIAGNOSIS — R102 Pelvic and perineal pain: Secondary | ICD-10-CM | POA: Diagnosis not present

## 2019-06-19 DIAGNOSIS — Z9049 Acquired absence of other specified parts of digestive tract: Secondary | ICD-10-CM | POA: Diagnosis not present

## 2019-06-19 DIAGNOSIS — D219 Benign neoplasm of connective and other soft tissue, unspecified: Secondary | ICD-10-CM

## 2019-06-19 DIAGNOSIS — I10 Essential (primary) hypertension: Secondary | ICD-10-CM | POA: Insufficient documentation

## 2019-06-19 DIAGNOSIS — R109 Unspecified abdominal pain: Secondary | ICD-10-CM | POA: Diagnosis not present

## 2019-06-19 HISTORY — DX: Essential (primary) hypertension: I10

## 2019-06-19 LAB — CBC WITH DIFFERENTIAL/PLATELET
Abs Immature Granulocytes: 0.05 10*3/uL (ref 0.00–0.07)
Basophils Absolute: 0 10*3/uL (ref 0.0–0.1)
Basophils Relative: 0 %
Eosinophils Absolute: 0 10*3/uL (ref 0.0–0.5)
Eosinophils Relative: 0 %
HCT: 40.2 % (ref 36.0–46.0)
Hemoglobin: 12.8 g/dL (ref 12.0–15.0)
Immature Granulocytes: 1 %
Lymphocytes Relative: 16 %
Lymphs Abs: 1.7 10*3/uL (ref 0.7–4.0)
MCH: 29.6 pg (ref 26.0–34.0)
MCHC: 31.8 g/dL (ref 30.0–36.0)
MCV: 93.1 fL (ref 80.0–100.0)
Monocytes Absolute: 0.6 10*3/uL (ref 0.1–1.0)
Monocytes Relative: 5 %
Neutro Abs: 8.3 10*3/uL — ABNORMAL HIGH (ref 1.7–7.7)
Neutrophils Relative %: 78 %
Platelets: 230 10*3/uL (ref 150–400)
RBC: 4.32 MIL/uL (ref 3.87–5.11)
RDW: 12.9 % (ref 11.5–15.5)
WBC: 10.6 10*3/uL — ABNORMAL HIGH (ref 4.0–10.5)
nRBC: 0 % (ref 0.0–0.2)

## 2019-06-19 LAB — COMPREHENSIVE METABOLIC PANEL
ALT: 19 U/L (ref 0–44)
AST: 22 U/L (ref 15–41)
Albumin: 3.8 g/dL (ref 3.5–5.0)
Alkaline Phosphatase: 76 U/L (ref 38–126)
Anion gap: 8 (ref 5–15)
BUN: 10 mg/dL (ref 6–20)
CO2: 30 mmol/L (ref 22–32)
Calcium: 9 mg/dL (ref 8.9–10.3)
Chloride: 102 mmol/L (ref 98–111)
Creatinine, Ser: 0.74 mg/dL (ref 0.44–1.00)
GFR calc Af Amer: >60 mL/min (ref >60)
GFR calc non Af Amer: >60 mL/min (ref >60)
Glucose, Bld: 98 mg/dL (ref 70–99)
Potassium: 3.2 mmol/L — ABNORMAL LOW (ref 3.5–5.1)
Sodium: 140 mmol/L (ref 135–145)
Total Bilirubin: 0.2 mg/dL — ABNORMAL LOW (ref 0.3–1.2)
Total Protein: 7.4 g/dL (ref 6.5–8.1)

## 2019-06-19 LAB — URINALYSIS, ROUTINE W REFLEX MICROSCOPIC
Bacteria, UA: NONE SEEN
Bilirubin Urine: NEGATIVE
Glucose, UA: NEGATIVE mg/dL
Ketones, ur: NEGATIVE mg/dL
Nitrite: NEGATIVE
Protein, ur: 30 mg/dL — AB
Specific Gravity, Urine: >1.046 — ABNORMAL HIGH (ref 1.005–1.030)
pH: 7 (ref 5.0–8.0)

## 2019-06-19 LAB — LIPASE, BLOOD: Lipase: 37 U/L (ref 11–51)

## 2019-06-19 LAB — I-STAT BETA HCG BLOOD, ED (MC, WL, AP ONLY): I-stat hCG, quantitative: <5 m[IU]/mL (ref <5)

## 2019-06-19 MED ORDER — ONDANSETRON HCL 4 MG/2ML IJ SOLN
4.0000 mg | Freq: Once | INTRAMUSCULAR | Status: AC
  Administered 2019-06-19: 4 mg via INTRAVENOUS
  Filled 2019-06-19: qty 2

## 2019-06-19 MED ORDER — SODIUM CHLORIDE 0.9 % IV SOLN
INTRAVENOUS | Status: DC
  Administered 2019-06-19: 16:00:00 via INTRAVENOUS

## 2019-06-19 MED ORDER — FENTANYL CITRATE (PF) 100 MCG/2ML IJ SOLN
50.0000 ug | Freq: Once | INTRAMUSCULAR | Status: AC
  Administered 2019-06-19: 50 ug via INTRAVENOUS
  Filled 2019-06-19: qty 2

## 2019-06-19 MED ORDER — NAPROXEN 500 MG PO TABS: 500.0000 mg | ORAL_TABLET | Freq: Two times a day (BID) | ORAL | 0 refills | Status: AC

## 2019-06-19 MED ORDER — HYDROCODONE-ACETAMINOPHEN 5-325 MG PO TABS: 1.0000 | ORAL_TABLET | Freq: Three times a day (TID) | ORAL | 0 refills | Status: DC | PRN

## 2019-06-19 MED ORDER — SODIUM CHLORIDE (PF) 0.9 % IJ SOLN
INTRAMUSCULAR | Status: AC
  Filled 2019-06-19: qty 50

## 2019-06-19 MED ORDER — IOHEXOL 300 MG/ML  SOLN
100.0000 mL | Freq: Once | INTRAMUSCULAR | Status: AC | PRN
  Administered 2019-06-19: 100 mL via INTRAVENOUS

## 2019-06-19 NOTE — Discharge Instructions (Addendum)
As cussed, you have been diagnosed with fibroids and abdominal pain.  On is important you follow-up with your obstetrician/gynecologist on Monday.  If you need to use the norco for pain control, do not also use tylenol. In the interim, monitor your condition carefully and do not hesitate to return if you develop new, or concerning changes.

## 2019-06-19 NOTE — ED Notes (Signed)
Pt ambulatory to bathroom
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
## 2019-06-19 NOTE — ED Notes (Signed)
Patient transported to CT 

## 2019-06-19 NOTE — ED Triage Notes (Signed)
Patient c/o pelvic pain x3 days. States small amount vaginal bleeding this morning. Reports taking aleve with no relief.

## 2019-06-28 DIAGNOSIS — Z01419 Encounter for gynecological examination (general) (routine) without abnormal findings: Secondary | ICD-10-CM | POA: Diagnosis not present

## 2019-06-28 DIAGNOSIS — Z130 Encounter for screening for diseases of the blood and blood-forming organs and certain disorders involving the immune mechanism: Secondary | ICD-10-CM | POA: Diagnosis not present

## 2019-06-28 DIAGNOSIS — D259 Leiomyoma of uterus, unspecified: Secondary | ICD-10-CM | POA: Diagnosis not present

## 2019-06-28 DIAGNOSIS — R102 Pelvic and perineal pain: Secondary | ICD-10-CM | POA: Diagnosis not present

## 2019-07-07 DIAGNOSIS — D252 Subserosal leiomyoma of uterus: Secondary | ICD-10-CM | POA: Diagnosis not present

## 2019-08-02 ENCOUNTER — Encounter: Payer: Self-pay | Admitting: Family Medicine

## 2019-08-16 ENCOUNTER — Ambulatory Visit
Admission: RE | Admit: 2019-08-16 | Discharge: 2019-08-16 | Disposition: A | Discharge status: Home / Self Care | Payer: BC Managed Care – PPO | Source: Ambulatory Visit | Attending: Family Medicine | Admitting: Family Medicine

## 2019-08-16 ENCOUNTER — Other Ambulatory Visit: Payer: Self-pay

## 2019-08-16 DIAGNOSIS — Z1231 Encounter for screening mammogram for malignant neoplasm of breast: Secondary | ICD-10-CM | POA: Diagnosis not present

## 2019-08-16 DIAGNOSIS — Z1239 Encounter for other screening for malignant neoplasm of breast: Secondary | ICD-10-CM

## 2019-08-17 ENCOUNTER — Other Ambulatory Visit: Payer: Self-pay | Admitting: Family Medicine

## 2019-08-19 ENCOUNTER — Other Ambulatory Visit: Payer: Self-pay | Admitting: Family Medicine

## 2019-08-19 DIAGNOSIS — R928 Other abnormal and inconclusive findings on diagnostic imaging of breast: Secondary | ICD-10-CM

## 2019-08-26 ENCOUNTER — Other Ambulatory Visit: Payer: Self-pay

## 2019-08-26 ENCOUNTER — Ambulatory Visit
Admission: RE | Admit: 2019-08-26 | Discharge: 2019-08-26 | Disposition: A | Discharge status: Home / Self Care | Payer: BC Managed Care – PPO | Source: Ambulatory Visit | Attending: Family Medicine | Admitting: Family Medicine

## 2019-08-26 ENCOUNTER — Other Ambulatory Visit: Payer: Self-pay | Admitting: Family Medicine

## 2019-08-26 DIAGNOSIS — R921 Mammographic calcification found on diagnostic imaging of breast: Secondary | ICD-10-CM | POA: Diagnosis not present

## 2019-08-26 DIAGNOSIS — R928 Other abnormal and inconclusive findings on diagnostic imaging of breast: Secondary | ICD-10-CM

## 2019-09-02 ENCOUNTER — Ambulatory Visit
Admission: RE | Admit: 2019-09-02 | Discharge: 2019-09-02 | Disposition: A | Discharge status: Home / Self Care | Payer: BC Managed Care – PPO | Source: Ambulatory Visit | Attending: Family Medicine | Admitting: Family Medicine

## 2019-09-02 ENCOUNTER — Other Ambulatory Visit: Payer: Self-pay | Admitting: Family Medicine

## 2019-09-02 ENCOUNTER — Other Ambulatory Visit: Payer: Self-pay

## 2019-09-02 DIAGNOSIS — N6011 Diffuse cystic mastopathy of right breast: Secondary | ICD-10-CM | POA: Diagnosis not present

## 2019-09-02 DIAGNOSIS — R921 Mammographic calcification found on diagnostic imaging of breast: Secondary | ICD-10-CM | POA: Diagnosis not present

## 2019-09-03 DIAGNOSIS — M71572 Other bursitis, not elsewhere classified, left ankle and foot: Secondary | ICD-10-CM | POA: Diagnosis not present

## 2019-09-03 DIAGNOSIS — M71571 Other bursitis, not elsewhere classified, right ankle and foot: Secondary | ICD-10-CM | POA: Diagnosis not present

## 2019-09-03 DIAGNOSIS — M7732 Calcaneal spur, left foot: Secondary | ICD-10-CM | POA: Diagnosis not present

## 2019-09-03 DIAGNOSIS — M7731 Calcaneal spur, right foot: Secondary | ICD-10-CM | POA: Diagnosis not present

## 2019-09-03 DIAGNOSIS — M722 Plantar fascial fibromatosis: Secondary | ICD-10-CM | POA: Diagnosis not present

## 2019-09-10 DIAGNOSIS — B351 Tinea unguium: Secondary | ICD-10-CM | POA: Diagnosis not present

## 2019-09-10 DIAGNOSIS — M722 Plantar fascial fibromatosis: Secondary | ICD-10-CM | POA: Diagnosis not present

## 2019-10-08 ENCOUNTER — Other Ambulatory Visit: Payer: Self-pay | Admitting: Family Medicine

## 2019-11-26 ENCOUNTER — Other Ambulatory Visit: Payer: Self-pay | Admitting: *Deleted

## 2019-11-26 DIAGNOSIS — Z20822 Contact with and (suspected) exposure to covid-19: Secondary | ICD-10-CM

## 2019-11-29 LAB — NOVEL CORONAVIRUS, NAA: SARS-CoV-2, NAA: NOT DETECTED

## 2019-12-03 ENCOUNTER — Ambulatory Visit (INDEPENDENT_AMBULATORY_CARE_PROVIDER_SITE_OTHER): Payer: BC Managed Care – PPO | Admitting: Family Medicine

## 2019-12-03 ENCOUNTER — Encounter: Payer: Self-pay | Admitting: Family Medicine

## 2019-12-03 ENCOUNTER — Other Ambulatory Visit: Payer: Self-pay

## 2019-12-03 VITALS — BP 124/78 | HR 70 | Temp 97.3°F | Ht 69.0 in | Wt 227.0 lb

## 2019-12-03 DIAGNOSIS — Z8249 Family history of ischemic heart disease and other diseases of the circulatory system: Secondary | ICD-10-CM | POA: Diagnosis not present

## 2019-12-03 DIAGNOSIS — Z23 Encounter for immunization: Secondary | ICD-10-CM

## 2019-12-03 DIAGNOSIS — I10 Essential (primary) hypertension: Secondary | ICD-10-CM

## 2019-12-03 DIAGNOSIS — E669 Obesity, unspecified: Secondary | ICD-10-CM

## 2019-12-03 DIAGNOSIS — L600 Ingrowing nail: Secondary | ICD-10-CM

## 2019-12-03 DIAGNOSIS — M722 Plantar fascial fibromatosis: Secondary | ICD-10-CM | POA: Diagnosis not present

## 2019-12-03 MED ORDER — HYDROCHLOROTHIAZIDE 25 MG PO TABS: 12.5000 mg | ORAL_TABLET | Freq: Every day | ORAL | Status: DC
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
## 2019-12-03 NOTE — Patient Instructions (Signed)
Please return in June 2021 for your annual complete physical; please come fasting.  I have referred you the [facility]. We will call you to get scheduled.  You have received the 2nd of 2 Shingrix vaccinations today.   If you have any questions or concerns, please don't hesitate to send me a message via MyChart or call the office at [phone]. Thank you for visiting with Deborah Stevens today! It's our pleasure caring for you.   Plantar Fasciitis  Plantar fasciitis is a painful foot condition that affects the heel. It occurs when the band of tissue that connects the toes to the heel bone (plantar fascia) becomes irritated. This can happen as the result of exercising too much or doing other repetitive activities (overuse injury). The pain from plantar fasciitis can range from mild irritation to severe pain that makes it difficult to walk or move. The pain is usually worse in the morning after sleeping, or after sitting or lying down for a while. Pain may also be worse after long periods of walking or standing. What are the causes? This condition may be caused by:  Standing for long periods of time.  Wearing shoes that do not have good arch support.  Doing activities that put stress on joints (high-impact activities), including running, aerobics, and ballet.  Being overweight.  An abnormal way of walking (gait).  Tight muscles in the back of your lower leg (calf).  High arches in your feet.  Starting a new athletic activity. What are the signs or symptoms? The main symptom of this condition is heel pain. Pain may:  Be worse with first steps after a time of rest, especially in the morning after sleeping or after you have been sitting or lying down for a while.  Be worse after long periods of standing still.  Decrease after 30-45 minutes of activity, such as gentle walking. How is this diagnosed? This condition may be diagnosed based on your medical history and your symptoms. Your  health care provider may ask questions about your activity level. Your health care provider will do a physical exam to check for:  A tender area on the bottom of your foot.  A high arch in your foot.  Pain when you move your foot.  Difficulty moving your foot. You may have imaging tests to confirm the diagnosis, such as:  X-rays.  Ultrasound.  MRI. How is this treated? Treatment for plantar fasciitis depends on how severe your condition is. Treatment may include:  Rest, ice, applying pressure (compression), and raising the affected foot (elevation). This may be called RICE therapy. Your health care provider may recommend RICE therapy along with over-the-counter pain medicines to manage your pain.  Exercises to stretch your calves and your plantar fascia.  A splint that holds your foot in a stretched, upward position while you sleep (night splint).  Physical therapy to relieve symptoms and prevent problems in the future.  Injections of steroid medicine (cortisone) to relieve pain and inflammation.  Stimulating your plantar fascia with electrical impulses (extracorporeal shock wave therapy). This is usually the last treatment option before surgery.  Surgery, if other treatments have not worked after 12 months. Follow these instructions at home:  Managing pain, stiffness, and swelling  If directed, put ice on the painful area: ? Put ice in a plastic bag, or use a frozen bottle of water. ? Place a towel between your skin and the bag or bottle. ? Roll the bottom of your foot over the  bag or bottle. ? Do this for 20 minutes, 2-3 times a day.  Wear athletic shoes that have air-sole or gel-sole cushions, or try wearing soft shoe inserts that are designed for plantar fasciitis.  Raise (elevate) your foot above the level of your heart while you are sitting or lying down. Activity  Avoid activities that cause pain. Ask your health care provider what activities are safe for  you.  Do physical therapy exercises and stretches as told by your health care provider.  Try activities and forms of exercise that are easier on your joints (low-impact). Examples include swimming, water aerobics, and biking. General instructions  Take over-the-counter and prescription medicines only as told by your health care provider.  Wear a night splint while sleeping, if told by your health care provider. Loosen the splint if your toes tingle, become numb, or turn cold and blue.  Maintain a healthy weight, or work with your health care provider to lose weight as needed.  Keep all follow-up visits as told by your health care provider. This is important. Contact a health care provider if you:  Have symptoms that do not go away after caring for yourself at home.  Have pain that gets worse.  Have pain that affects your ability to move or do your daily activities. Summary  Plantar fasciitis is a painful foot condition that affects the heel. It occurs when the band of tissue that connects the toes to the heel bone (plantar fascia) becomes irritated.  The main symptom of this condition is heel pain that may be worse after exercising too much or standing still for a long time.  Treatment varies, but it usually starts with rest, ice, compression, and elevation (RICE therapy) and over-the-counter medicines to manage pain. This information is not intended to replace advice given to you by your health care provider. Make sure you discuss any questions you have with your health care provider. Document Released: 09/03/2001 Document Revised: 11/21/2017 Document Reviewed: 10/06/2017 Elsevier Patient Education  2020 [employer].

## 2019-12-11 ENCOUNTER — Encounter: Payer: Self-pay | Admitting: Sports Medicine

## 2019-12-11 ENCOUNTER — Other Ambulatory Visit: Payer: Self-pay

## 2019-12-11 ENCOUNTER — Ambulatory Visit: Payer: BC Managed Care – PPO | Admitting: Sports Medicine

## 2019-12-11 VITALS — BP 127/83 | HR 97

## 2019-12-11 DIAGNOSIS — L608 Other nail disorders: Secondary | ICD-10-CM | POA: Diagnosis not present

## 2019-12-11 DIAGNOSIS — M79674 Pain in right toe(s): Secondary | ICD-10-CM

## 2019-12-11 DIAGNOSIS — M79675 Pain in left toe(s): Secondary | ICD-10-CM | POA: Diagnosis not present

## 2019-12-11 DIAGNOSIS — L600 Ingrowing nail: Secondary | ICD-10-CM

## 2019-12-11 DIAGNOSIS — B351 Tinea unguium: Secondary | ICD-10-CM | POA: Diagnosis not present
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
## 2019-12-11 NOTE — Patient Instructions (Addendum)

## 2019-12-14 ENCOUNTER — Encounter: Payer: Self-pay | Admitting: Sports Medicine

## 2019-12-18 DIAGNOSIS — L089 Local infection of the skin and subcutaneous tissue, unspecified: Secondary | ICD-10-CM | POA: Diagnosis not present

## 2019-12-20 ENCOUNTER — Other Ambulatory Visit: Payer: Self-pay | Admitting: Sports Medicine

## 2019-12-20 ENCOUNTER — Telehealth: Payer: Self-pay

## 2019-12-20 MED ORDER — AMOXICILLIN-POT CLAVULANATE 875-125 MG PO TABS: 1.0000 | ORAL_TABLET | Freq: Two times a day (BID) | ORAL | 0 refills | Status: DC

## 2019-12-20 NOTE — Telephone Encounter (Signed)
-----   Message from Wardell Heath, [place] sent at 12/20/2019  1:45 PM EST -----  ----- Message ----- From: Landis Martins, DPM Sent: 12/20/2019   9:38 AM EST To: Wardell Heath, CMA

## 2019-12-20 NOTE — Telephone Encounter (Signed)
Called Pt to notify her of the Dr's instructions. Pt stated she had went to Urgent Care this morning because there was an increase of pain and she had tried to take the bandage with no results. Pt states at Urgent Care the numb her toe and took the dressing off with no issues. Pt also states she was Rx sulfa and Pt denies any pain or signs of infection

## 2019-12-20 NOTE — Progress Notes (Signed)
I have sent Augmentin to her pharmacy for her to take to prevent against infection. Patient should continue with trying to soak and tease the guaze off her toe that is stuck on. She should saturate the guaze/dressing with peroxide in a basin and sit there for about 10 mins and then add warm water and soak for 20 mins. After soaking saturate the guaze/dressing again with more peroxide and gently tease/pull the dressing off. She should redress with neosporin and bandaid or a non-stick guaze after she has removed the stuck on dressing. -Dr. Cannon Kettle

## 2019-12-20 NOTE — Telephone Encounter (Signed)
Patient should take the antibiotics given by urgent care and does not have to take the ones that I prescribed. She does not need both antibiotics -Dr. Chauncey Cruel

## 2019-12-22 ENCOUNTER — Encounter: Payer: Self-pay | Admitting: Family Medicine

## 2019-12-23 NOTE — Telephone Encounter (Signed)
Do you have any additional information in regards to this?

## 2020-01-04 ENCOUNTER — Ambulatory Visit: Payer: BC Managed Care – PPO | Admitting: Sports Medicine

## 2020-01-04 ENCOUNTER — Encounter: Payer: Self-pay | Admitting: Sports Medicine

## 2020-01-04 ENCOUNTER — Other Ambulatory Visit: Payer: Self-pay

## 2020-01-04 DIAGNOSIS — M79674 Pain in right toe(s): Secondary | ICD-10-CM

## 2020-01-04 DIAGNOSIS — B351 Tinea unguium: Secondary | ICD-10-CM

## 2020-01-04 DIAGNOSIS — Z9889 Other specified postprocedural states: Secondary | ICD-10-CM
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
## 2020-01-19 ENCOUNTER — Telehealth: Payer: Self-pay | Admitting: *Deleted

## 2020-01-19 ENCOUNTER — Encounter: Payer: Self-pay | Admitting: Sports Medicine

## 2020-01-19 NOTE — Telephone Encounter (Signed)
I reviewed pt's labs and Dr. Cannon Kettle had contacted MyChart with the results. I called pt to make sure she had received. Pt states she had received a message from Dr. Cannon Kettle and thanked me for the call.

## 2020-01-19 NOTE — Telephone Encounter (Signed)
Pt states there is a message in MyChart and she can't understand the biopsy results.

## 2020-02-05 ENCOUNTER — Ambulatory Visit: Payer: BC Managed Care – PPO

## 2020-03-14 ENCOUNTER — Other Ambulatory Visit: Payer: Self-pay | Admitting: Family Medicine

## 2020-03-30 ENCOUNTER — Other Ambulatory Visit: Payer: Self-pay | Admitting: Family Medicine

## 2020-04-04 ENCOUNTER — Encounter: Payer: Self-pay | Admitting: Family Medicine

## 2020-04-12 ENCOUNTER — Encounter: Payer: Self-pay | Admitting: Family Medicine

## 2020-04-12 ENCOUNTER — Other Ambulatory Visit: Payer: Self-pay

## 2020-04-12 ENCOUNTER — Ambulatory Visit (INDEPENDENT_AMBULATORY_CARE_PROVIDER_SITE_OTHER): Payer: BC Managed Care – PPO | Admitting: Family Medicine

## 2020-04-12 ENCOUNTER — Telehealth: Payer: Self-pay

## 2020-04-12 VITALS — BP 120/80 | HR 96 | Temp 98.4°F | Resp 15 | Wt 220.0 lb

## 2020-04-12 DIAGNOSIS — Z20822 Contact with and (suspected) exposure to covid-19: Secondary | ICD-10-CM | POA: Diagnosis not present

## 2020-04-12 DIAGNOSIS — B349 Viral infection, unspecified: Secondary | ICD-10-CM

## 2020-04-12 DIAGNOSIS — Z20828 Contact with and (suspected) exposure to other viral communicable diseases: Secondary | ICD-10-CM | POA: Diagnosis not present

## 2020-04-12 LAB — POCT INFLUENZA A/B
Influenza A, POC: NEGATIVE
Influenza B, POC: NEGATIVE

## 2020-04-12 NOTE — Addendum Note (Signed)
Addended by: Doran Clay A on: 04/12/2020 04:51 PM   Modules accepted: Orders

## 2020-04-12 NOTE — Telephone Encounter (Signed)
pt  rcvd her 2nd covid vaccine on 04/05/20 and she is not feeling well. patient temp was 99 on yesterday. offered patient virtual appt for today at 3pm. pt is requesting to come into the office. would you like to see pt in office?

## 2020-04-12 NOTE — Patient Instructions (Signed)
Please follow up as scheduled for your next visit with me: 06/07/2020 for your physical.  Your flu test is negative today; your vitals signs are stable.  Please self isolate/quarantine until the results of your covid test return. Get that done today.   If you have any questions or concerns, please don't hesitate to send me a message via MyChart or call the office at [phone]. Thank you for visiting with [place] today! It's our pleasure caring for you.

## 2020-04-12 NOTE — Telephone Encounter (Signed)
Yes
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
## 2020-04-12 NOTE — Telephone Encounter (Signed)
Spoke with patient, states that she was already told it was OK per Jonni Sanger to come into office?

## 2020-06-05 ENCOUNTER — Other Ambulatory Visit: Payer: Self-pay

## 2020-06-05 ENCOUNTER — Encounter: Payer: Self-pay | Admitting: Family Medicine

## 2020-06-05 ENCOUNTER — Ambulatory Visit: Payer: BC Managed Care – PPO | Admitting: Family Medicine

## 2020-06-05 VITALS — BP 118/70 | HR 96 | Temp 98.1°F | Ht 69.0 in | Wt 221.6 lb

## 2020-06-05 DIAGNOSIS — Z8249 Family history of ischemic heart disease and other diseases of the circulatory system: Secondary | ICD-10-CM

## 2020-06-05 DIAGNOSIS — Z0000 Encounter for general adult medical examination without abnormal findings: Secondary | ICD-10-CM

## 2020-06-05 DIAGNOSIS — I10 Essential (primary) hypertension: Secondary | ICD-10-CM

## 2020-06-05 DIAGNOSIS — R7989 Other specified abnormal findings of blood chemistry: Secondary | ICD-10-CM

## 2020-06-05 DIAGNOSIS — Z1159 Encounter for screening for other viral diseases: Secondary | ICD-10-CM

## 2020-06-05 DIAGNOSIS — E669 Obesity, unspecified: Secondary | ICD-10-CM
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
## 2020-06-05 NOTE — Patient Instructions (Signed)
Please return in 6 months for hypertension follow up.  I will release your lab results to you on your MyChart account with further instructions. Please reply with any questions.    If you have any questions or concerns, please don't hesitate to send me a message via MyChart or call the office at [phone]. Thank you for visiting with [place] today! It's our pleasure caring for you.   Preventive Care 80-54 Years Old, Female Preventive care refers to visits with your health care provider and lifestyle choices that can promote health and wellness. This includes:  A yearly physical exam. This may also be called an annual well check.  Regular dental visits and eye exams.  Immunizations.  Screening for certain conditions.  Healthy lifestyle choices, such as eating a healthy diet, getting regular exercise, not using drugs or products that contain nicotine and tobacco, and limiting alcohol use. What can I expect for my preventive care visit? Physical exam Your health care provider will check your:  Height and weight. This may be used to calculate body mass index (BMI), which tells if you are at a healthy weight.  Heart rate and blood pressure.  Skin for abnormal spots. Counseling Your health care provider may ask you questions about your:  Alcohol, tobacco, and drug use.  Emotional well-being.  Home and relationship well-being.  Sexual activity.  Eating habits.  Work and work Statistician.  Method of birth control.  Menstrual cycle.  Pregnancy history. What immunizations do I need?  Influenza (flu) vaccine  This is recommended every year. Tetanus, diphtheria, and pertussis (Tdap) vaccine  You may need a Td booster every 10 years. Varicella (chickenpox) vaccine  You may need this if you have not been vaccinated. Zoster (shingles) vaccine  You may need this after age 33. Measles, mumps, and rubella (MMR) vaccine  You may need at least one dose of MMR if you were  born in 1957 or later. You may also need a second dose. Pneumococcal conjugate (PCV13) vaccine  You may need this if you have certain conditions and were not previously vaccinated. Pneumococcal polysaccharide (PPSV23) vaccine  You may need one or two doses if you smoke cigarettes or if you have certain conditions. Meningococcal conjugate (MenACWY) vaccine  You may need this if you have certain conditions. Hepatitis A vaccine  You may need this if you have certain conditions or if you travel or work in places where you may be exposed to hepatitis A. Hepatitis B vaccine  You may need this if you have certain conditions or if you travel or work in places where you may be exposed to hepatitis B. Haemophilus influenzae type b (Hib) vaccine  You may need this if you have certain conditions. Human papillomavirus (HPV) vaccine  If recommended by your health care provider, you may need three doses over 6 months. You may receive vaccines as individual doses or as more than one vaccine together in one shot (combination vaccines). Talk with your health care provider about the risks and benefits of combination vaccines. What tests do I need? Blood tests  Lipid and cholesterol levels. These may be checked every 5 years, or more frequently if you are over 62 years old.  Hepatitis C test.  Hepatitis B test. Screening  Lung cancer screening. You may have this screening every year starting at age 42 if you have a 30-pack-year history of smoking and currently smoke or have quit within the past 15 years.  Colorectal cancer screening. All adults  should have this screening starting at age 34 and continuing until age 106. Your health care provider may recommend screening at age 29 if you are at increased risk. You will have tests every 1-10 years, depending on your results and the type of screening test.  Diabetes screening. This is done by checking your blood sugar (glucose) after you have not eaten  for a while (fasting). You may have this done every 1-3 years.  Mammogram. This may be done every 1-2 years. Talk with your health care provider about when you should start having regular mammograms. This may depend on whether you have a family history of breast cancer.  BRCA-related cancer screening. This may be done if you have a family history of breast, ovarian, tubal, or peritoneal cancers.  Pelvic exam and Pap test. This may be done every 3 years starting at age 27. Starting at age 27, this may be done every 5 years if you have a Pap test in combination with an HPV test. Other tests  Sexually transmitted disease (STD) testing.  Bone density scan. This is done to screen for osteoporosis. You may have this scan if you are at high risk for osteoporosis. Follow these instructions at home: Eating and drinking  Eat a diet that includes fresh fruits and vegetables, whole grains, lean protein, and low-fat dairy.  Take vitamin and mineral supplements as recommended by your health care provider.  Do not drink alcohol if: ? Your health care provider tells you not to drink. ? You are pregnant, may be pregnant, or are planning to become pregnant.  If you drink alcohol: ? Limit how much you have to 0-1 drink a day. ? Be aware of how much alcohol is in your drink. In the [place] one drink equals one 12 oz bottle of beer (355 mL), one 5 oz glass of wine (148 mL), or one 1 oz glass of hard liquor (44 mL). Lifestyle  Take daily care of your teeth and gums.  Stay active. Exercise for at least 30 minutes on 5 or more days each week.  Do not use any products that contain nicotine or tobacco, such as cigarettes, e-cigarettes, and chewing tobacco. If you need help quitting, ask your health care provider.  If you are sexually active, practice safe sex. Use a condom or other form of birth control (contraception) in order to prevent pregnancy and STIs (sexually transmitted infections).  If told by  your health care provider, take low-dose aspirin daily starting at age 16. What's next?  Visit your health care provider once a year for a well check visit.  Ask your health care provider how often you should have your eyes and teeth checked.  Stay up to date on all vaccines. This information is not intended to replace advice given to you by your health care provider. Make sure you discuss any questions you have with your health care provider. Document Revised: 08/20/2018 Document Reviewed: 08/20/2018 Elsevier Patient Education  2020 [employer].

## 2020-06-06 ENCOUNTER — Encounter: Payer: Self-pay | Admitting: Family Medicine

## 2020-06-06 LAB — COMPREHENSIVE METABOLIC PANEL
ALT: 531 U/L — ABNORMAL HIGH (ref 0–35)
AST: 364 U/L — ABNORMAL HIGH (ref 0–37)
Albumin: 4.4 g/dL (ref 3.5–5.2)
Alkaline Phosphatase: 94 U/L (ref 39–117)
BUN: 11 mg/dL (ref 6–23)
CO2: 33 mEq/L — ABNORMAL HIGH (ref 19–32)
Calcium: 9.8 mg/dL (ref 8.4–10.5)
Chloride: 98 mEq/L (ref 96–112)
Creatinine, Ser: 0.74 mg/dL (ref 0.40–1.20)
GFR: 99.03 mL/min (ref >60.00)
Glucose, Bld: 105 mg/dL — ABNORMAL HIGH (ref 70–99)
Potassium: 3.2 mEq/L — ABNORMAL LOW (ref 3.5–5.1)
Sodium: 138 mEq/L (ref 135–145)
Total Bilirubin: 0.4 mg/dL (ref 0.2–1.2)
Total Protein: 7.5 g/dL (ref 6.0–8.3)

## 2020-06-06 LAB — HEPATITIS C ANTIBODY
Hepatitis C Ab: NONREACTIVE
SIGNAL TO CUT-OFF: 0.04 (ref <1.00)

## 2020-06-06 LAB — LIPID PANEL
Cholesterol: 187 mg/dL (ref 0–200)
HDL: 71.4 mg/dL (ref >39.00)
LDL Cholesterol: 91 mg/dL (ref 0–99)
NonHDL: 115.98
Total CHOL/HDL Ratio: 3
Triglycerides: 125 mg/dL (ref 0.0–149.0)
VLDL: 25 mg/dL (ref 0.0–40.0)

## 2020-06-06 LAB — CBC WITH DIFFERENTIAL/PLATELET
Basophils Absolute: 0 10*3/uL (ref 0.0–0.1)
Basophils Relative: 0.9 % (ref 0.0–3.0)
Eosinophils Absolute: 0.1 10*3/uL (ref 0.0–0.7)
Eosinophils Relative: 1.7 % (ref 0.0–5.0)
HCT: 40.7 % (ref 36.0–46.0)
Hemoglobin: 13.8 g/dL (ref 12.0–15.0)
Lymphocytes Relative: 35.2 % (ref 12.0–46.0)
Lymphs Abs: 1.9 10*3/uL (ref 0.7–4.0)
MCHC: 33.8 g/dL (ref 30.0–36.0)
MCV: 89 fl (ref 78.0–100.0)
Monocytes Absolute: 0.5 10*3/uL (ref 0.1–1.0)
Monocytes Relative: 8.4 % (ref 3.0–12.0)
Neutro Abs: 2.9 10*3/uL (ref 1.4–7.7)
Neutrophils Relative %: 53.8 % (ref 43.0–77.0)
Platelets: 179 10*3/uL (ref 150.0–400.0)
RBC: 4.57 Mil/uL (ref 3.87–5.11)
RDW: 13.8 % (ref 11.5–15.5)
WBC: 5.5 10*3/uL (ref 4.0–10.5)

## 2020-06-07 ENCOUNTER — Encounter: Payer: BC Managed Care – PPO | Admitting: Family Medicine

## 2020-06-08 MED ORDER — TRIAMTERENE-HCTZ 37.5-25 MG PO TABS: 1.0000 | ORAL_TABLET | Freq: Every day | ORAL | 3 refills | Status: DC

## 2020-06-08 NOTE — Addendum Note (Signed)
Addended by: Billey Chang on: 06/08/2020 08:47 AM   Modules accepted: Orders

## 2020-06-13 ENCOUNTER — Other Ambulatory Visit: Payer: Self-pay

## 2020-06-13 ENCOUNTER — Other Ambulatory Visit (INDEPENDENT_AMBULATORY_CARE_PROVIDER_SITE_OTHER): Payer: BC Managed Care – PPO

## 2020-06-13 DIAGNOSIS — R7989 Other specified abnormal findings of blood chemistry: Secondary | ICD-10-CM

## 2020-06-13 DIAGNOSIS — M94261 Chondromalacia, right knee: Secondary | ICD-10-CM | POA: Diagnosis not present

## 2020-06-13 DIAGNOSIS — L659 Nonscarring hair loss, unspecified: Secondary | ICD-10-CM | POA: Diagnosis not present

## 2020-06-14 LAB — HEPATITIS B SURFACE ANTIGEN: Hepatitis B Surface Ag: NONREACTIVE

## 2020-06-14 LAB — ANA: Anti Nuclear Antibody (ANA): NEGATIVE

## 2020-06-14 LAB — HEPATITIS B CORE ANTIBODY, TOTAL: Hep B Core Total Ab: NONREACTIVE

## 2020-06-14 LAB — ALPHA-1-ANTITRYPSIN: A-1 Antitrypsin, Ser: 132 mg/dL (ref 83–199)

## 2020-06-14 LAB — IRON,TIBC AND FERRITIN PANEL
%SAT: 28 % (calc) (ref 16–45)
Ferritin: 89 ng/mL (ref 16–232)
Iron: 95 ug/dL (ref 45–160)
TIBC: 343 mcg/dL (calc) (ref 250–450)

## 2020-06-14 LAB — CERULOPLASMIN: Ceruloplasmin: 31 mg/dL (ref 18–53)

## 2020-06-14 LAB — HEPATITIS B SURFACE ANTIBODY,QUALITATIVE: Hep B S Ab: NONREACTIVE

## 2020-06-21 ENCOUNTER — Encounter: Payer: Self-pay | Admitting: Family Medicine

## 2020-07-03 ENCOUNTER — Other Ambulatory Visit: Payer: Self-pay

## 2020-07-03 ENCOUNTER — Ambulatory Visit: Payer: BC Managed Care – PPO | Admitting: Family Medicine

## 2020-07-03 ENCOUNTER — Encounter: Payer: Self-pay | Admitting: Family Medicine

## 2020-07-03 VITALS — BP 112/68 | Ht 69.0 in | Wt 223.8 lb

## 2020-07-03 DIAGNOSIS — I10 Essential (primary) hypertension: Secondary | ICD-10-CM | POA: Diagnosis not present

## 2020-07-03 DIAGNOSIS — R748 Abnormal levels of other serum enzymes: Secondary | ICD-10-CM

## 2020-07-03 NOTE — Patient Instructions (Signed)
Please cancel you physical appt in September and schedule a f/u HTN visit in December.   We will call you with your lab results .  If you have any questions or concerns, please don't hesitate to send me a message via MyChart or call the office at [phone]. Thank you for visiting with [place] today! It's our pleasure caring for you.   Liver Function Tests Why am I having this test? Liver function tests are done to see how well your liver is working. The proteins and enzymes measured in the test can alert your health care provider to inflammation, damage, or disease in your liver. It is common to have liver function tests:  When you are taking certain medicines.  If you have liver disease.  If you drink a lot of alcohol.  When you are not feeling well.  When you have other conditions that may affect your liver.  During annual physical exams.  If you have symptoms such as yellowing of the skin (jaundice), abdominal pain, or nausea and vomiting. What is being tested? These tests measure various substances in your blood. This may include:  Alanine transaminase (ALT). This is an enzyme in the liver.  Aspartate transaminase (AST). This is an enzyme in the liver, heart, and muscles.  Alkaline phosphatase (ALP). This is a protein in the liver, bile ducts, bone, and other body tissues.  Total bilirubin. This is a yellow pigment in bile.  Albumin. This is a protein in the liver.  Prothrombin time and international normalized ratio (PT and INR). PT measures the time it takes for your blood to clot. INR is a calculation of blood clotting time based on your PT result. It is also calculated based on normal ranges defined by the lab that processed your test.  Total protein. This includes two proteins, albumin and globulin, found in the blood. What kind of sample is taken?  A blood sample is required for this test. It is usually collected by inserting a needle into a blood vessel. How do I  prepare for this test? How you prepare will depend on which tests are being done and the reason for doing them. You may need to:  Avoid eating for 4-6 hours before the test, or as told by your health care provider.  Stop taking certain medicines before your blood test, as told by your health care provider. Tell a health care provider about:  All medicines you are taking, including vitamins, herbs, eye drops, creams, and over-the-counter medicines.  Any medical conditions you have.  Whether you are pregnant or may be pregnant. How are the results reported? Your test results will be reported as values. Your health care provider will compare your results to normal ranges that were established after testing a large group of people (reference ranges). Reference ranges may vary among labs and hospitals. For the substances measured in liver function tests, common reference ranges are: ALT  Infant: 10-40 international units/L.  Child or adult: 4-36 international units/L at 37C or 4-36 units/L (SI units).  Reference ranges may be higher for older adults. AST  Newborn 49-77 days old: 35-140 units/L.  Child younger than 60 years old: 15-60 units/L.  74-46 years old: 15-50 units/L.  74-72 years old: 10-50 units/L.  35-26 years old: 10-40 units/L.  Adult: 0-35 units/L or 0-0.58 microkatals/L (SI units).  Reference ranges may be higher for older adults. ALP  Child younger than 48 years old: 85-235 units/L.  31-1 years old: 65-210 units/L.  54-47 years old: 60-300 units/L.  94-59 years old: 30-200 units/L.  Adult: 30-120 units/L or 0.5-2.0 microkatals/L (SI units).  Reference ranges may be higher for older adults. Total bilirubin  Newborn: 1.0-12.0 mg/dL or 17.1-205 micromoles/L (SI units).  Child or adult: 0.3-1.0 mg/dL or 5.1-17 micromoles/L. Albumin  Premature infant: 3.0-4.2 g/dL.  Newborn: 3.5-5.4 g/dL.  Infant: 4.4-5.4 g/dL.  Child: 4.0-5.9 g/dL.  Adult: 3.5-5.0 g/dL  or 35-50 g/L (SI units). PT  11.0-12.5 seconds; 85%-100%. INR  0.8-1.1. Total protein  Premature infant: 4.2-7.6 g/dL.  Newborn: 4.6-7.4 g/dL.  Infant: 6.0-6.7 g/dL.  Child: 6.2-8.0 g/dL.  Adult: 6.4-8.3 g/dL or 64-83 g/L (SI units). What do the results mean? Results that are within the reference ranges are considered normal. For each substance measured, results outside the reference range can indicate various health issues. ALT  Levels above the normal range may indicate liver disease. AST  Levels above the normal range may indicate liver disease. Sometimes levels also increase after burns, surgery, heart attack, muscle damage, or seizure. ALP  Levels above the normal range may be seen in biliary obstruction, liver diseases, bone disease, thyroid disease, tumors, fractures, leukemia, lymphoma, or several other conditions. People with blood type O or B may show higher levels after a fatty meal.  Levels below the normal range may indicate bone and teeth conditions, malnutrition, protein deficiency, or Wilson's disease. Total bilirubin  Levels above the normal range may indicate problems with the liver, gallbladder, or bile ducts. Albumin  Levels above the normal range may indicate dehydration. They may also be caused by a diet that is high in protein.  Levels below the normal range may indicate kidney disease, liver disease, or malabsorption of nutrients. PT and INR  Levels above the normal range mean that your blood is clotting slower than normal. This may be due to blood disorders, liver disorders, or low levels of vitamin K. Total protein  Levels above the normal range may be due to infection or other diseases.  Levels below the normal range may be due to an immune system disorder, bleeding, burns, kidney disorder, liver disease, trouble absorbing or getting nutrients, or other conditions that affect the intestines. Talk with your health care provider about what your  results mean. Questions to ask your health care provider Ask your health care provider, or the department that is doing the test:  When will my results be ready?  How will I get my results?  What are my treatment options?  What other tests do I need?  What are my next steps? Summary  Liver function tests are done to see how well your liver is working.  These tests measure various proteins and enzymes in your blood. The results can alert your health care provider to inflammation, damage, or disease in your liver.  Talk with your health care provider about what your results mean. This information is not intended to replace advice given to you by your health care provider. Make sure you discuss any questions you have with your health care provider. Document Revised: 07/29/2018 Document Reviewed: 09/23/2017 Elsevier Patient Education  [place].
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
## 2020-07-04 ENCOUNTER — Encounter: Payer: Self-pay | Admitting: Family Medicine

## 2020-07-04 LAB — HEPATIC FUNCTION PANEL
ALT: 27 U/L (ref 0–35)
AST: 25 U/L (ref 0–37)
Albumin: 4.3 g/dL (ref 3.5–5.2)
Alkaline Phosphatase: 85 U/L (ref 39–117)
Bilirubin, Direct: 0.1 mg/dL (ref 0.0–0.3)
Total Bilirubin: 0.3 mg/dL (ref 0.2–1.2)
Total Protein: 7.3 g/dL (ref 6.0–8.3)

## 2020-07-04 LAB — BASIC METABOLIC PANEL
BUN: 10 mg/dL (ref 6–23)
CO2: 33 mEq/L — ABNORMAL HIGH (ref 19–32)
Calcium: 9.6 mg/dL (ref 8.4–10.5)
Chloride: 99 mEq/L (ref 96–112)
Creatinine, Ser: 0.86 mg/dL (ref 0.40–1.20)
GFR: 83.24 mL/min (ref >60.00)
Glucose, Bld: 88 mg/dL (ref 70–99)
Potassium: 3.9 mEq/L (ref 3.5–5.1)
Sodium: 139 mEq/L (ref 135–145)

## 2020-07-05 ENCOUNTER — Encounter: Payer: Self-pay | Admitting: Family Medicine

## 2020-07-05 LAB — PT AND PTT

## 2020-07-05 LAB — HEPATITIS PANEL, ACUTE
Hep A IgM: NONREACTIVE
Hep B C IgM: NONREACTIVE
Hepatitis B Surface Ag: NONREACTIVE
Hepatitis C Ab: NONREACTIVE
SIGNAL TO CUT-OFF: 0.04 (ref <1.00)

## 2020-07-05 LAB — SPECIMEN STATUS REPORT

## 2020-07-06 ENCOUNTER — Telehealth: Payer: Self-pay | Admitting: *Deleted

## 2020-07-06 ENCOUNTER — Other Ambulatory Visit: Payer: Self-pay | Admitting: *Deleted

## 2020-07-06 DIAGNOSIS — R748 Abnormal levels of other serum enzymes: Secondary | ICD-10-CM

## 2020-07-06 IMAGING — CT CT ABDOMEN AND PELVIS WITH CONTRAST
2 of 5 series · 17 of 46 positions shown, 19 images · IV contrast (OMNIPAQUE)
Comparison: None.

CLINICAL DATA: Severe lower abdominal and pelvic pain for 3 days.
Nausea. Loose stools. Suspected diverticulitis.

EXAM:
CT ABDOMEN AND PELVIS WITH CONTRAST
TECHNIQUE: Multidetector CT imaging of the abdomen and pelvis was performed
using the standard protocol following bolus administration of
intravenous contrast.
CONTRAST:  100mL OMNIPAQUE IOHEXOL 300 MG/ML  SOLN

[Series 2: axial st · axial · 0.68mm/px · z∈[-611,-226]mm · 14 of 89 slices shown, 16 images]
[im 6/89  soft-tissue]
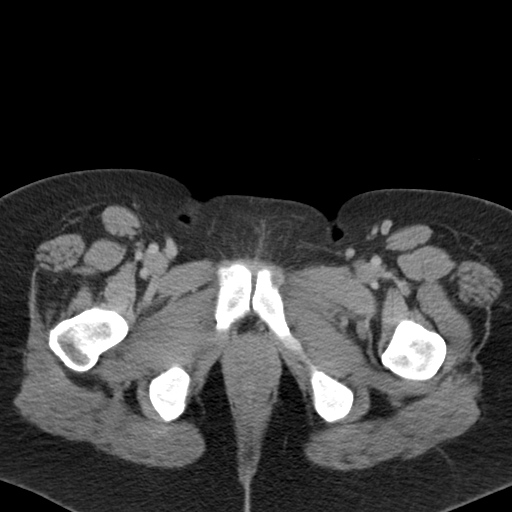
[im 6/89  bone]
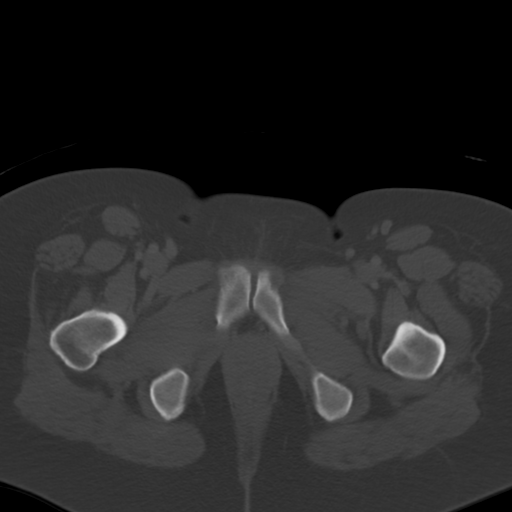
[im 11/89  soft-tissue]
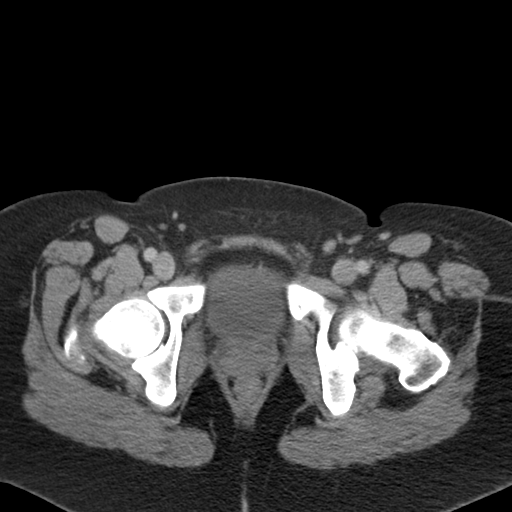
[im 16/89  soft-tissue]
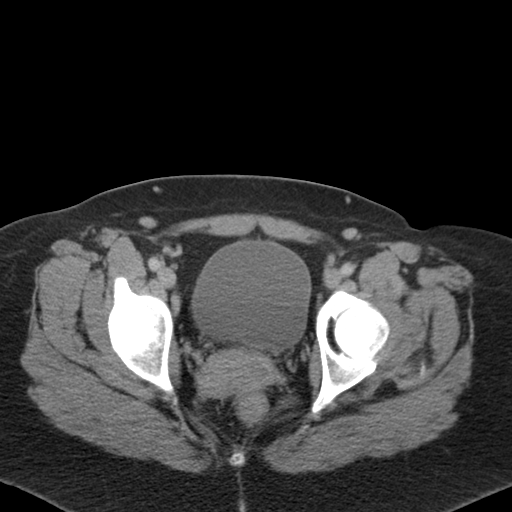
[im 26/89  soft-tissue]
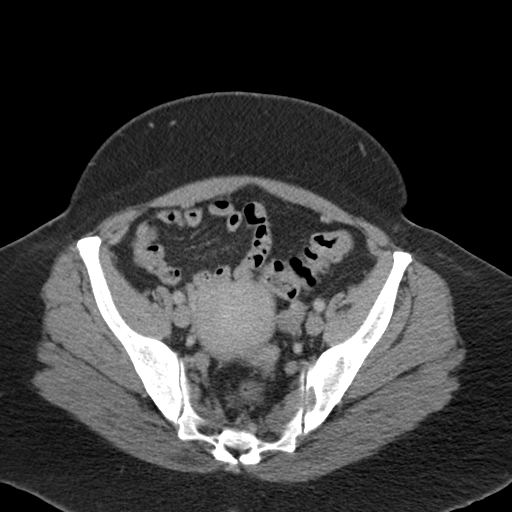
[im 32/89  soft-tissue]
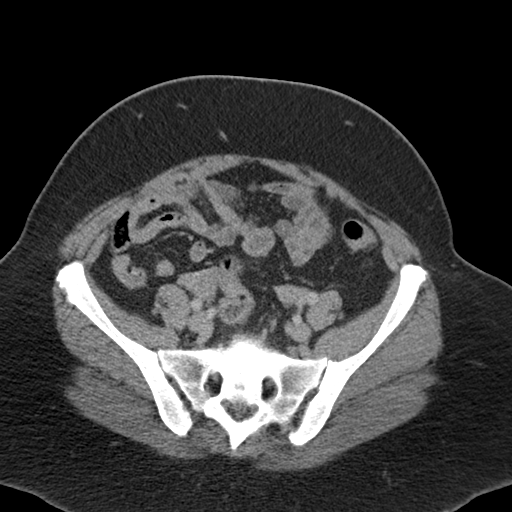
[im 37/89  soft-tissue]
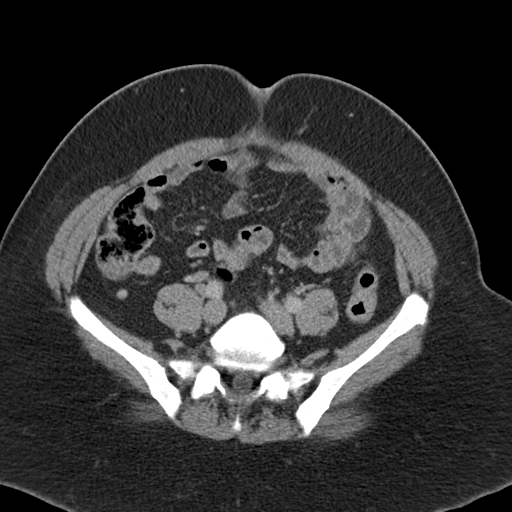
[im 42/89  soft-tissue]
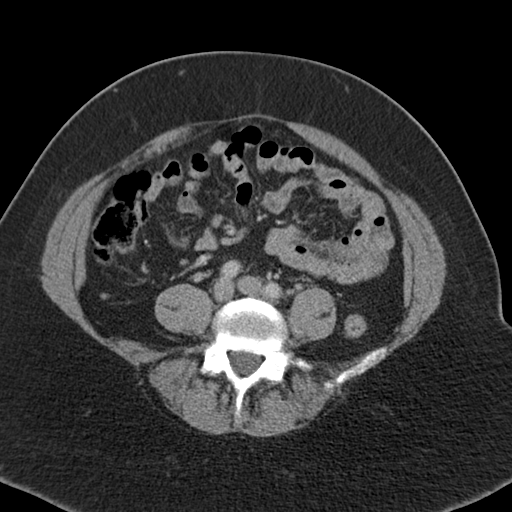
[im 47/89  soft-tissue]
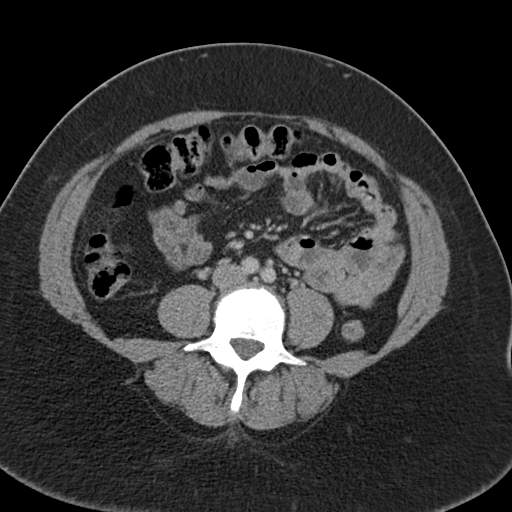
[im 52/89  soft-tissue]
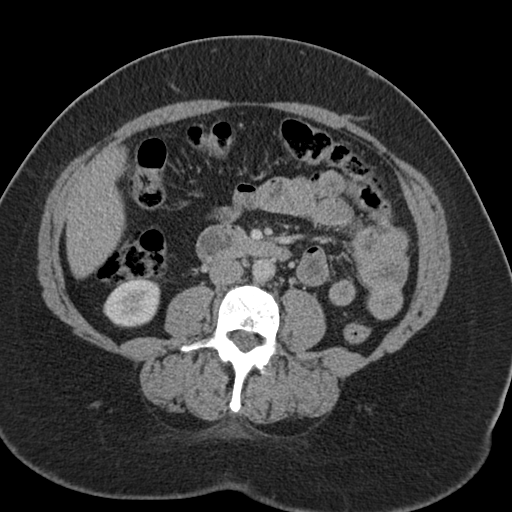
[im 52/89  bone]
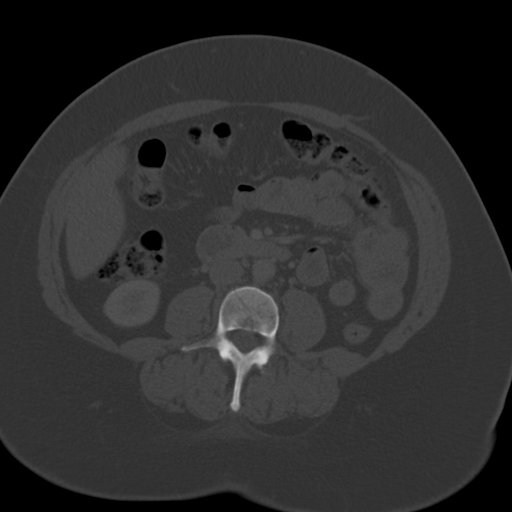
[im 57/89  soft-tissue]
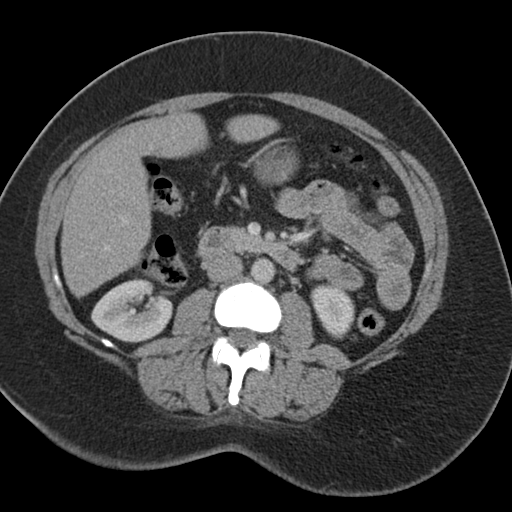
[im 68/89  soft-tissue]
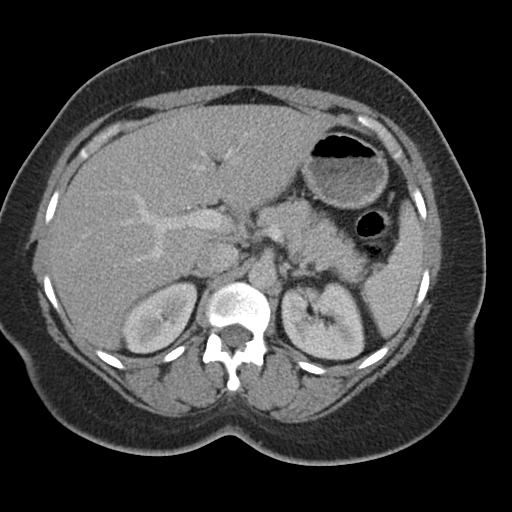
[im 73/89  soft-tissue]
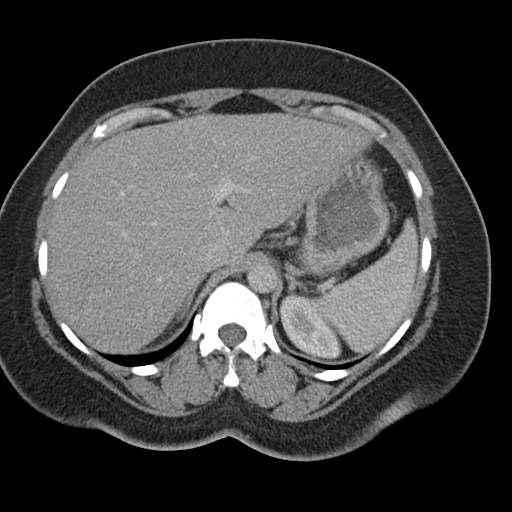
[im 78/89  soft-tissue]
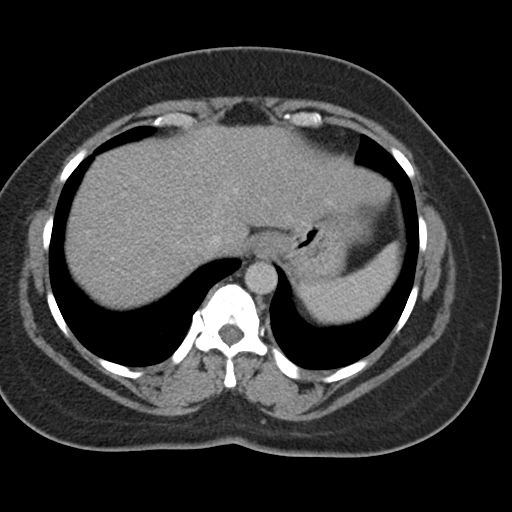
[im 83/89  soft-tissue]
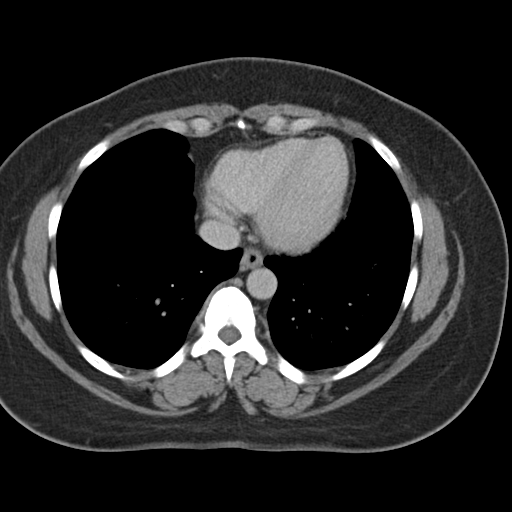

[Series 4: coronal st · coronal · 0.85mm/px · 3 of 142 slices shown]
[im 48/142  soft-tissue]
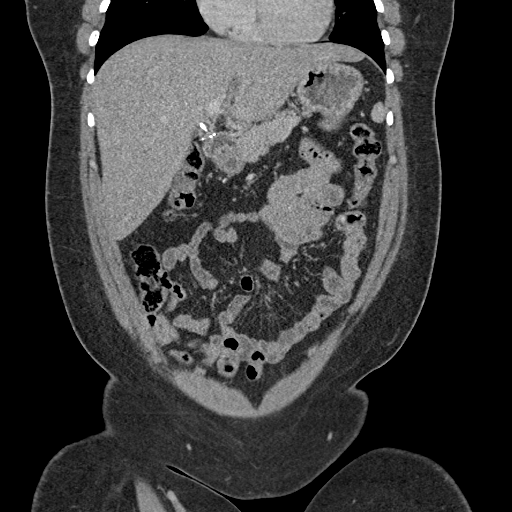
[im 63/142  soft-tissue]
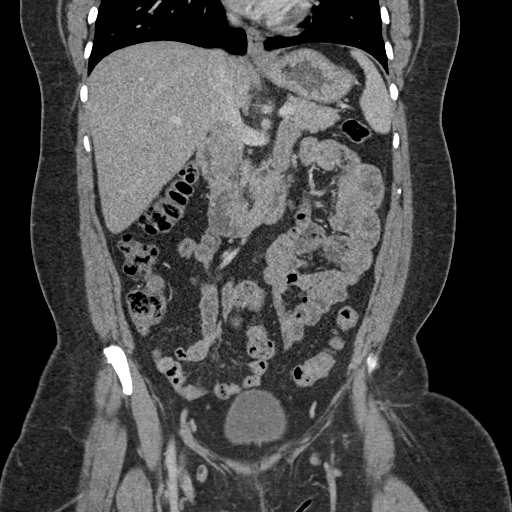
[im 79/142  soft-tissue]
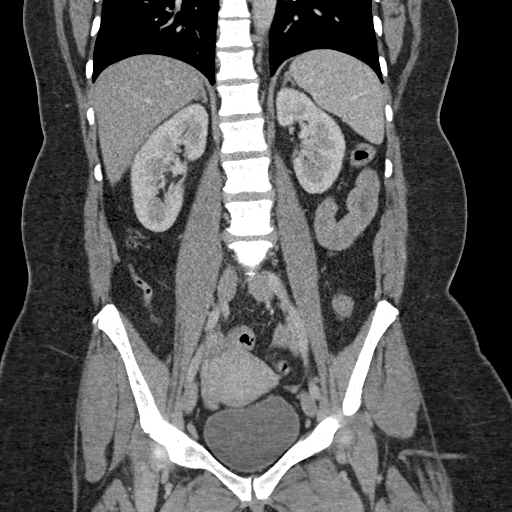

[17 of 46 positions shown; findings below may reference images not displayed]

FINDINGS: Lower Chest: No acute findings.

Hepatobiliary: No hepatic masses identified. Prior cholecystectomy.
No evidence of biliary obstruction.

Pancreas:  No mass or inflammatory changes.

Spleen: Within normal limits in size and appearance.

Adrenals/Urinary Tract: No masses identified. No evidence of
hydronephrosis.

Stomach/Bowel: No evidence of obstruction, inflammatory process or
abnormal fluid collections. Normal appendix visualized.

Vascular/Lymphatic: No pathologically enlarged lymph nodes. No
abdominal aortic aneurysm.

Reproductive: 2 cm subserosal fibroid arising from the right uterine
fundus. Adnexal regions are unremarkable.

Other:  None.

Musculoskeletal:  No suspicious bone lesions identified.
IMPRESSION: 1. No evidence of diverticulitis or other acute findings.
2. 2 cm uterine fibroid.

## 2020-07-06 NOTE — Telephone Encounter (Signed)
Patient is scheduled for lab appointment on Monday at 3pm.

## 2020-07-06 NOTE — Telephone Encounter (Signed)
LVM to return call   Pt needs appointment for PT test

## 2020-07-07 ENCOUNTER — Encounter: Payer: Self-pay | Admitting: Family Medicine

## 2020-07-10 ENCOUNTER — Other Ambulatory Visit: Payer: BC Managed Care – PPO

## 2020-07-10 ENCOUNTER — Other Ambulatory Visit: Payer: Self-pay

## 2020-07-10 DIAGNOSIS — R748 Abnormal levels of other serum enzymes: Secondary | ICD-10-CM

## 2020-07-11 LAB — PT AND PTT
INR: 0.9 (ref 0.9–1.2)
Prothrombin Time: 10.1 s (ref 9.1–12.0)
aPTT: 28 s (ref 24–33)

## 2020-07-11 LAB — SPECIMEN STATUS REPORT

## 2020-07-13 ENCOUNTER — Encounter: Payer: Self-pay | Admitting: Family Medicine

## 2020-08-14 ENCOUNTER — Encounter: Payer: Self-pay | Admitting: Family Medicine

## 2020-08-29 DIAGNOSIS — Z20828 Contact with and (suspected) exposure to other viral communicable diseases: Secondary | ICD-10-CM | POA: Diagnosis not present

## 2020-09-20 ENCOUNTER — Other Ambulatory Visit: Payer: Self-pay | Admitting: Family Medicine

## 2020-09-21 ENCOUNTER — Encounter: Payer: BC Managed Care – PPO | Admitting: Family Medicine

## 2020-10-16 ENCOUNTER — Other Ambulatory Visit: Payer: Self-pay | Admitting: Family Medicine

## 2020-10-16 DIAGNOSIS — Z1231 Encounter for screening mammogram for malignant neoplasm of breast: Secondary | ICD-10-CM

## 2020-11-10 ENCOUNTER — Ambulatory Visit: Payer: BC Managed Care – PPO | Admitting: Family Medicine

## 2020-11-10 ENCOUNTER — Encounter: Payer: Self-pay | Admitting: Family Medicine

## 2020-11-10 VITALS — BP 158/90 | HR 108 | Temp 97.9°F | Wt 214.4 lb

## 2020-11-10 DIAGNOSIS — L989 Disorder of the skin and subcutaneous tissue, unspecified: Secondary | ICD-10-CM
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
## 2020-11-10 NOTE — Patient Instructions (Signed)
I've referred you to dermatology. They should be calling you.   Keep bandaid or bandage on your thumb to protect it while it heals.   You may need a biopsy.   Please research [facility] and give the name to your mother's doctor in [place]. Maybe the referral can be placed directly.  Otherwise, if urgent, you may need to use an urgent care for this purpose.  Please schedule a new patient visit with me as well. This will likely be in February .

## 2020-11-24 ENCOUNTER — Ambulatory Visit
Admission: RE | Admit: 2020-11-24 | Discharge: 2020-11-24 | Disposition: A | Discharge status: Home / Self Care | Payer: BC Managed Care – PPO | Source: Ambulatory Visit | Attending: Family Medicine | Admitting: Family Medicine

## 2020-11-24 ENCOUNTER — Other Ambulatory Visit: Payer: Self-pay

## 2020-11-24 DIAGNOSIS — Z1231 Encounter for screening mammogram for malignant neoplasm of breast: Secondary | ICD-10-CM | POA: Diagnosis not present

## 2020-11-29 ENCOUNTER — Other Ambulatory Visit: Payer: Self-pay | Admitting: Family Medicine

## 2020-11-29 DIAGNOSIS — R928 Other abnormal and inconclusive findings on diagnostic imaging of breast: Secondary | ICD-10-CM

## 2020-12-08 ENCOUNTER — Ambulatory Visit: Payer: BC Managed Care – PPO

## 2020-12-08 ENCOUNTER — Other Ambulatory Visit: Payer: Self-pay

## 2020-12-08 ENCOUNTER — Ambulatory Visit: Payer: BC Managed Care – PPO | Admitting: Family Medicine

## 2020-12-08 ENCOUNTER — Encounter: Payer: Self-pay | Admitting: Family Medicine

## 2020-12-08 VITALS — BP 118/80 | HR 77 | Temp 97.9°F | Ht 69.0 in | Wt 213.2 lb

## 2020-12-08 DIAGNOSIS — I10 Essential (primary) hypertension: Secondary | ICD-10-CM | POA: Diagnosis not present

## 2020-12-08 NOTE — Patient Instructions (Signed)
Please return in June 2022 for your annual complete physical; please come fasting.  Glad you are well!! Merry Christmas!! Travel safely!  If you have any questions or concerns, please don't hesitate to send me a message via MyChart or call the office at [phone]. Thank you for visiting with [place] today! It's our pleasure caring for you.
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
## 2020-12-19 ENCOUNTER — Other Ambulatory Visit: Payer: Self-pay

## 2020-12-19 ENCOUNTER — Ambulatory Visit
Admission: RE | Admit: 2020-12-19 | Discharge: 2020-12-19 | Disposition: A | Discharge status: Home / Self Care | Payer: BC Managed Care – PPO | Source: Ambulatory Visit | Attending: Family Medicine | Admitting: Family Medicine

## 2020-12-19 ENCOUNTER — Other Ambulatory Visit: Payer: Self-pay | Admitting: Family Medicine

## 2020-12-19 DIAGNOSIS — R928 Other abnormal and inconclusive findings on diagnostic imaging of breast: Secondary | ICD-10-CM

## 2020-12-19 DIAGNOSIS — N6489 Other specified disorders of breast: Secondary | ICD-10-CM | POA: Diagnosis not present

## 2020-12-19 DIAGNOSIS — N63 Unspecified lump in unspecified breast: Secondary | ICD-10-CM

## 2020-12-21 ENCOUNTER — Ambulatory Visit
Admission: RE | Admit: 2020-12-21 | Discharge: 2020-12-21 | Disposition: A | Discharge status: Home / Self Care | Payer: BC Managed Care – PPO | Source: Ambulatory Visit | Attending: Family Medicine | Admitting: Family Medicine

## 2020-12-21 ENCOUNTER — Other Ambulatory Visit: Payer: Self-pay

## 2020-12-21 DIAGNOSIS — N63 Unspecified lump in unspecified breast: Secondary | ICD-10-CM

## 2020-12-21 DIAGNOSIS — N6323 Unspecified lump in the left breast, lower outer quadrant: Secondary | ICD-10-CM | POA: Diagnosis not present

## 2020-12-26 ENCOUNTER — Other Ambulatory Visit: Payer: Self-pay | Admitting: Family Medicine

## 2020-12-26 DIAGNOSIS — R928 Other abnormal and inconclusive findings on diagnostic imaging of breast: Secondary | ICD-10-CM

## 2021-01-05 ENCOUNTER — Encounter: Payer: Self-pay | Admitting: Family Medicine

## 2021-01-05 DIAGNOSIS — Z1152 Encounter for screening for COVID-19: Secondary | ICD-10-CM | POA: Diagnosis not present

## 2021-01-08 NOTE — Telephone Encounter (Signed)
Please schedule a virtual appointment with pt.  Thank You

## 2021-01-10 DIAGNOSIS — Z1152 Encounter for screening for COVID-19: Secondary | ICD-10-CM | POA: Diagnosis not present

## 2021-01-12 ENCOUNTER — Ambulatory Visit
Admission: RE | Admit: 2021-01-12 | Discharge: 2021-01-12 | Disposition: A | Discharge status: Home / Self Care | Payer: BC Managed Care – PPO | Source: Ambulatory Visit | Attending: Family Medicine | Admitting: Family Medicine

## 2021-01-12 ENCOUNTER — Other Ambulatory Visit: Payer: Self-pay

## 2021-01-12 DIAGNOSIS — R928 Other abnormal and inconclusive findings on diagnostic imaging of breast: Secondary | ICD-10-CM

## 2021-01-22 ENCOUNTER — Ambulatory Visit
Admission: RE | Admit: 2021-01-22 | Discharge: 2021-01-22 | Disposition: A | Discharge status: Home / Self Care | Payer: BC Managed Care – PPO | Source: Ambulatory Visit | Attending: Family Medicine | Admitting: Family Medicine

## 2021-01-22 ENCOUNTER — Other Ambulatory Visit: Payer: Self-pay

## 2021-01-22 ENCOUNTER — Other Ambulatory Visit (HOSPITAL_COMMUNITY): Payer: Self-pay | Admitting: Diagnostic Radiology

## 2021-01-22 DIAGNOSIS — N6320 Unspecified lump in the left breast, unspecified quadrant: Secondary | ICD-10-CM | POA: Diagnosis not present

## 2021-01-22 DIAGNOSIS — N6012 Diffuse cystic mastopathy of left breast: Secondary | ICD-10-CM | POA: Diagnosis not present

## 2021-02-23 ENCOUNTER — Encounter: Payer: Self-pay | Admitting: Family Medicine

## 2021-02-26 ENCOUNTER — Encounter: Payer: Self-pay | Admitting: Family Medicine

## 2021-02-26 ENCOUNTER — Other Ambulatory Visit: Payer: Self-pay

## 2021-02-26 ENCOUNTER — Ambulatory Visit (INDEPENDENT_AMBULATORY_CARE_PROVIDER_SITE_OTHER): Payer: BC Managed Care – PPO | Admitting: Family Medicine

## 2021-02-26 VITALS — BP 120/80 | HR 89 | Temp 98.0°F | Ht 69.0 in | Wt 215.0 lb

## 2021-02-26 DIAGNOSIS — J301 Allergic rhinitis due to pollen: Secondary | ICD-10-CM | POA: Diagnosis not present

## 2021-02-26 DIAGNOSIS — L239 Allergic contact dermatitis, unspecified cause: Secondary | ICD-10-CM | POA: Insufficient documentation

## 2021-02-26 DIAGNOSIS — N644 Mastodynia: Secondary | ICD-10-CM

## 2021-02-26 DIAGNOSIS — N6012 Diffuse cystic mastopathy of left breast: Secondary | ICD-10-CM | POA: Diagnosis not present

## 2021-02-26 DIAGNOSIS — N641 Fat necrosis of breast: Secondary | ICD-10-CM | POA: Diagnosis not present

## 2021-02-26 MED ORDER — TRIAMCINOLONE ACETONIDE 0.1 % EX CREA: 1.0000 "application " | TOPICAL_CREAM | Freq: Two times a day (BID) | CUTANEOUS | 0 refills | Status: DC

## 2021-02-26 MED ORDER — CETIRIZINE HCL 10 MG PO TABS
10.0000 mg | ORAL_TABLET | Freq: Every day | ORAL | 3 refills | Status: DC
Start: 2021-02-26 — End: 2022-05-14
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
## 2021-02-26 NOTE — Patient Instructions (Signed)
Please follow up as scheduled for your next visit with me: 06/12/2021   If you have any questions or concerns, please don't hesitate to send me a message via MyChart or call the office at [phone]. Thank you for visiting with [place] today! It's our pleasure caring for you.  I have ordered a mammogram and/or bone density for you as we discussed today: [x]   Mammogram , diagnostic []   Bone Density  Please call the office checked below to schedule your appointment:  []   [facility]      [place], [place]         [x]   [facility]  [employer], [place]  You can postpone this a few months until you are feeling better. 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

## 2021-04-10 ENCOUNTER — Other Ambulatory Visit: Payer: Self-pay | Admitting: Family Medicine

## 2021-04-12 ENCOUNTER — Ambulatory Visit: Payer: BC Managed Care – PPO | Admitting: Physician Assistant

## 2021-05-12 ENCOUNTER — Other Ambulatory Visit: Payer: Self-pay | Admitting: Family Medicine

## 2021-05-26 ENCOUNTER — Other Ambulatory Visit: Payer: Self-pay | Admitting: Family Medicine

## 2021-06-06 ENCOUNTER — Encounter: Payer: Self-pay | Admitting: Family Medicine

## 2021-06-12 ENCOUNTER — Other Ambulatory Visit: Payer: Self-pay

## 2021-06-12 ENCOUNTER — Ambulatory Visit (INDEPENDENT_AMBULATORY_CARE_PROVIDER_SITE_OTHER): Payer: BC Managed Care – PPO | Admitting: Family Medicine

## 2021-06-12 ENCOUNTER — Encounter: Payer: BC Managed Care – PPO | Admitting: Family Medicine

## 2021-06-12 ENCOUNTER — Encounter: Payer: Self-pay | Admitting: Family Medicine

## 2021-06-12 VITALS — BP 124/84 | HR 73 | Temp 98.2°F | Resp 15 | Wt 221.0 lb

## 2021-06-12 DIAGNOSIS — E669 Obesity, unspecified: Secondary | ICD-10-CM

## 2021-06-12 DIAGNOSIS — Z8249 Family history of ischemic heart disease and other diseases of the circulatory system: Secondary | ICD-10-CM | POA: Diagnosis not present

## 2021-06-12 DIAGNOSIS — I10 Essential (primary) hypertension: Secondary | ICD-10-CM

## 2021-06-12 DIAGNOSIS — Z86010 Personal history of colonic polyps: Secondary | ICD-10-CM

## 2021-06-12 DIAGNOSIS — Z0000 Encounter for general adult medical examination without abnormal findings: Secondary | ICD-10-CM | POA: Diagnosis not present

## 2021-06-12 DIAGNOSIS — M26621 Arthralgia of right temporomandibular joint: Secondary | ICD-10-CM

## 2021-06-12 DIAGNOSIS — L659 Nonscarring hair loss, unspecified: Secondary | ICD-10-CM

## 2021-06-12 LAB — LIPID PANEL
Cholesterol: 187 mg/dL (ref 0–200)
HDL: 65.8 mg/dL (ref >39.00)
LDL Cholesterol: 98 mg/dL (ref 0–99)
NonHDL: 120.89
Total CHOL/HDL Ratio: 3
Triglycerides: 116 mg/dL (ref 0.0–149.0)
VLDL: 23.2 mg/dL (ref 0.0–40.0)

## 2021-06-12 LAB — CBC WITH DIFFERENTIAL/PLATELET
Basophils Absolute: 0 10*3/uL (ref 0.0–0.1)
Basophils Relative: 0.6 % (ref 0.0–3.0)
Eosinophils Absolute: 0.1 10*3/uL (ref 0.0–0.7)
Eosinophils Relative: 1.4 % (ref 0.0–5.0)
HCT: 41.9 % (ref 36.0–46.0)
Hemoglobin: 14.2 g/dL (ref 12.0–15.0)
Lymphocytes Relative: 40.5 % (ref 12.0–46.0)
Lymphs Abs: 2.6 10*3/uL (ref 0.7–4.0)
MCHC: 33.8 g/dL (ref 30.0–36.0)
MCV: 88.2 fl (ref 78.0–100.0)
Monocytes Absolute: 0.4 10*3/uL (ref 0.1–1.0)
Monocytes Relative: 6.4 % (ref 3.0–12.0)
Neutro Abs: 3.3 10*3/uL (ref 1.4–7.7)
Neutrophils Relative %: 51.1 % (ref 43.0–77.0)
Platelets: 210 10*3/uL (ref 150.0–400.0)
RBC: 4.75 Mil/uL (ref 3.87–5.11)
RDW: 13.9 % (ref 11.5–15.5)
WBC: 6.5 10*3/uL (ref 4.0–10.5)

## 2021-06-12 LAB — COMPREHENSIVE METABOLIC PANEL
ALT: 18 U/L (ref 0–35)
AST: 23 U/L (ref 0–37)
Albumin: 4.3 g/dL (ref 3.5–5.2)
Alkaline Phosphatase: 74 U/L (ref 39–117)
BUN: 11 mg/dL (ref 6–23)
CO2: 33 mEq/L — ABNORMAL HIGH (ref 19–32)
Calcium: 9.9 mg/dL (ref 8.4–10.5)
Chloride: 96 mEq/L (ref 96–112)
Creatinine, Ser: 0.83 mg/dL (ref 0.40–1.20)
GFR: 79.7 mL/min (ref >60.00)
Glucose, Bld: 86 mg/dL (ref 70–99)
Potassium: 3.2 mEq/L — ABNORMAL LOW (ref 3.5–5.1)
Sodium: 136 mEq/L (ref 135–145)
Total Bilirubin: 0.5 mg/dL (ref 0.2–1.2)
Total Protein: 8 g/dL (ref 6.0–8.3)
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
## 2021-06-12 NOTE — Patient Instructions (Addendum)
Please return in 6 months for hypertension follow up.   I will release your lab results to you on your MyChart account with further instructions. Please reply with any questions.    If you have any questions or concerns, please don't hesitate to send me a message via MyChart or call the office at [phone]. Thank you for visiting with [place] today! It's our pleasure caring for you.   Temporomandibular Joint Syndrome  Temporomandibular joint syndrome (TMJ syndrome) is a condition that causes pain in the temporomandibular joints. These joints are located near your ears and allow your jaw to open and close. For people with TMJ syndrome, chewing,biting, or other movements of the jaw can be difficult or painful. TMJ syndrome is often mild and goes away within a few weeks. However, sometimes the condition becomes a long-term (chronic) problem. What are the causes? This condition may be caused by: Grinding your teeth or clenching your jaw. Some people do this when they are under stress. Arthritis. Injury to the jaw. Head or neck injury. Teeth or dentures that are not aligned well. In some cases, the cause of TMJ syndrome may not be known. What are the signs or symptoms? The most common symptom of this condition is an aching pain on the side of the head in the area of the TMJ. Other symptoms may include: Pain when moving your jaw, such as when chewing or biting. Being unable to open your jaw all the way. Making a clicking sound when you open your mouth. Headache. Earache. Neck or shoulder pain. How is this diagnosed? This condition may be diagnosed based on: Your symptoms and medical history. A physical exam. Your health care provider may check the range of motion of your jaw. Imaging tests, such as X-rays or an MRI. You may also need to see your dentist, who will determine if your teeth and jaware lined up correctly. How is this treated? TMJ syndrome often goes away on its own. If treatment  is needed, the options may include: Eating soft foods and applying ice or heat. Medicines to relieve pain or inflammation. Medicines or massage to relax the muscles. A splint, bite plate, or mouthpiece to prevent teeth grinding or jaw clenching. Relaxation techniques or counseling to help reduce stress. A therapy for pain in which an electrical current is applied to the nerves through the skin (transcutaneous electrical nerve stimulation). Acupuncture. This is sometimes helpful to relieve pain. Jaw surgery. This is rarely needed. Follow these instructions at home:  Eating and drinking Eat a soft diet if you are having trouble chewing. Avoid foods that require a lot of chewing. Do not chew gum. General instructions Take over-the-counter and prescription medicines only as told by your health care provider. If directed, put ice on the painful area. Put ice in a plastic bag. Place a towel between your skin and the bag. Leave the ice on for 20 minutes, 2-3 times a day. Apply a warm, wet cloth (warm compress) to the painful area as directed. Massage your jaw area and do any jaw stretching exercises as told by your health care provider. If you were given a splint, bite plate, or mouthpiece, wear it as told by your health care provider. Keep all follow-up visits as told by your health care provider. This is important. Contact a health care provider if: You are having trouble eating. You have new or worsening symptoms. Get help right away if: Your jaw locks open or closed. Summary Temporomandibular joint syndrome (TMJ  syndrome) is a condition that causes pain in the temporomandibular joints. These joints are located near your ears and allow your jaw to open and close. TMJ syndrome is often mild and goes away within a few weeks. However, sometimes the condition becomes a long-term (chronic) problem. Symptoms include an aching pain on the side of the head in the area of the TMJ, pain when  chewing or biting, and being unable to open your jaw all the way. You may also make a clicking sound when you open your mouth. TMJ syndrome often goes away on its own. If treatment is needed, it may include medicines to relieve pain, reduce inflammation, or relax the muscles. A splint, bite plate, or mouthpiece may also be used to prevent teeth grinding or jaw clenching. This information is not intended to replace advice given to you by your health care provider. Make sure you discuss any questions you have with your healthcare provider. Document Revised: 02/20/2018 Document Reviewed: 01/20/2018 Elsevier Patient Education  2022 [employer].

## 2021-06-14 ENCOUNTER — Other Ambulatory Visit: Payer: Self-pay

## 2021-06-14 MED ORDER — POTASSIUM CHLORIDE CRYS ER 20 MEQ PO TBCR: 20.0000 meq | EXTENDED_RELEASE_TABLET | Freq: Every day | ORAL | 3 refills | Status: DC

## 2021-08-16 DIAGNOSIS — Z20822 Contact with and (suspected) exposure to covid-19: Secondary | ICD-10-CM | POA: Diagnosis not present

## 2021-09-09 ENCOUNTER — Encounter: Payer: Self-pay | Admitting: Family Medicine

## 2021-09-09 DIAGNOSIS — Z20822 Contact with and (suspected) exposure to covid-19: Secondary | ICD-10-CM | POA: Diagnosis not present

## 2021-09-10 ENCOUNTER — Encounter: Payer: Self-pay | Admitting: Family Medicine

## 2021-09-10 ENCOUNTER — Other Ambulatory Visit: Payer: Self-pay

## 2021-09-10 ENCOUNTER — Telehealth: Payer: Self-pay

## 2021-09-10 ENCOUNTER — Telehealth (INDEPENDENT_AMBULATORY_CARE_PROVIDER_SITE_OTHER): Payer: BC Managed Care – PPO | Admitting: Family Medicine

## 2021-09-10 DIAGNOSIS — U071 COVID-19: Secondary | ICD-10-CM

## 2021-09-10 DIAGNOSIS — I10 Essential (primary) hypertension: Secondary | ICD-10-CM

## 2021-09-10 DIAGNOSIS — E669 Obesity, unspecified: Secondary | ICD-10-CM

## 2021-09-10 MED ORDER — MOLNUPIRAVIR EUA 200MG CAPSULE: 4.0000 | ORAL_CAPSULE | Freq: Two times a day (BID) | ORAL | 0 refills | Status: AC

## 2021-09-10 MED ORDER — GUAIFENESIN-CODEINE 100-10 MG/5ML PO SOLN
5.0000 mL | Freq: Four times a day (QID) | ORAL | 0 refills | Status: DC | PRN
Start: 2021-09-10 — End: 2021-09-10

## 2021-09-10 MED ORDER — PROMETHAZINE-DM 6.25-15 MG/5ML PO SYRP: 5.0000 mL | ORAL_SOLUTION | Freq: Four times a day (QID) | ORAL | 0 refills | Status: DC | PRN
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
## 2021-09-10 NOTE — Telephone Encounter (Signed)
error 

## 2021-09-16 DIAGNOSIS — Z20822 Contact with and (suspected) exposure to covid-19: Secondary | ICD-10-CM | POA: Diagnosis not present

## 2021-09-18 DIAGNOSIS — U071 COVID-19: Secondary | ICD-10-CM | POA: Diagnosis not present

## 2021-09-18 DIAGNOSIS — Z20822 Contact with and (suspected) exposure to covid-19: Secondary | ICD-10-CM | POA: Diagnosis not present

## 2021-09-26 DIAGNOSIS — Z03818 Encounter for observation for suspected exposure to other biological agents ruled out: Secondary | ICD-10-CM | POA: Diagnosis not present

## 2021-09-26 DIAGNOSIS — Z23 Encounter for immunization: Secondary | ICD-10-CM | POA: Diagnosis not present

## 2021-09-26 DIAGNOSIS — Z20822 Contact with and (suspected) exposure to covid-19: Secondary | ICD-10-CM | POA: Diagnosis not present

## 2021-11-28 DIAGNOSIS — Z20822 Contact with and (suspected) exposure to covid-19: Secondary | ICD-10-CM | POA: Diagnosis not present

## 2021-11-28 DIAGNOSIS — J101 Influenza due to other identified influenza virus with other respiratory manifestations: Secondary | ICD-10-CM | POA: Diagnosis not present

## 2021-11-28 DIAGNOSIS — R6889 Other general symptoms and signs: Secondary | ICD-10-CM | POA: Diagnosis not present

## 2021-12-13 ENCOUNTER — Encounter: Payer: Self-pay | Admitting: Family Medicine

## 2021-12-13 ENCOUNTER — Ambulatory Visit (INDEPENDENT_AMBULATORY_CARE_PROVIDER_SITE_OTHER): Payer: BC Managed Care – PPO | Admitting: Family Medicine

## 2021-12-13 VITALS — BP 119/78 | HR 86 | Temp 97.4°F | Ht 69.0 in | Wt 214.0 lb

## 2021-12-13 DIAGNOSIS — E669 Obesity, unspecified: Secondary | ICD-10-CM

## 2021-12-13 DIAGNOSIS — N644 Mastodynia: Secondary | ICD-10-CM | POA: Diagnosis not present

## 2021-12-13 DIAGNOSIS — I10 Essential (primary) hypertension: Secondary | ICD-10-CM | POA: Diagnosis not present

## 2021-12-13 LAB — BASIC METABOLIC PANEL
BUN: 9 mg/dL (ref 6–23)
CO2: 32 mEq/L (ref 19–32)
Calcium: 9.6 mg/dL (ref 8.4–10.5)
Chloride: 99 mEq/L (ref 96–112)
Creatinine, Ser: 0.81 mg/dL (ref 0.40–1.20)
GFR: 81.78 mL/min (ref >60.00)
Glucose, Bld: 92 mg/dL (ref 70–99)
Potassium: 4.1 mEq/L (ref 3.5–5.1)
Sodium: 138 mEq/L (ref 135–145)
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
## 2021-12-13 NOTE — Patient Instructions (Signed)
Please return in June 2023 for your annual complete physical; please come fasting.   Please schedule a female wellness exam with pap and breast exam with your GYN now!! And schedule your mammogram!   [facility] Back [facility]!  If you have any questions or concerns, please don't hesitate to send me a message via MyChart or call the office at [phone]. Thank you for visiting with Deborah Stevens today! It's our pleasure caring for you.

## 2022-01-23 DIAGNOSIS — N644 Mastodynia: Secondary | ICD-10-CM | POA: Diagnosis not present

## 2022-02-03 DIAGNOSIS — S83411A Sprain of medial collateral ligament of right knee, initial encounter: Secondary | ICD-10-CM | POA: Diagnosis not present

## 2022-02-03 DIAGNOSIS — M25561 Pain in right knee: Secondary | ICD-10-CM | POA: Diagnosis not present

## 2022-02-05 DIAGNOSIS — Z6833 Body mass index (BMI) 33.0-33.9, adult: Secondary | ICD-10-CM | POA: Diagnosis not present

## 2022-02-05 DIAGNOSIS — Z124 Encounter for screening for malignant neoplasm of cervix: Secondary | ICD-10-CM | POA: Diagnosis not present

## 2022-02-05 DIAGNOSIS — Z01419 Encounter for gynecological examination (general) (routine) without abnormal findings: Secondary | ICD-10-CM | POA: Diagnosis not present

## 2022-02-05 LAB — HM PAP SMEAR: HM Pap smear: NEGATIVE

## 2022-02-05 LAB — RESULTS CONSOLE HPV: CHL HPV: NEGATIVE

## 2022-05-14 ENCOUNTER — Other Ambulatory Visit: Payer: Self-pay | Admitting: Family Medicine

## 2022-05-19 ENCOUNTER — Other Ambulatory Visit: Payer: Self-pay | Admitting: Family Medicine

## 2022-06-02 ENCOUNTER — Other Ambulatory Visit: Payer: Self-pay | Admitting: Family Medicine

## 2022-06-13 DIAGNOSIS — Z1331 Encounter for screening for depression: Secondary | ICD-10-CM | POA: Diagnosis not present

## 2022-06-13 DIAGNOSIS — Z20822 Contact with and (suspected) exposure to covid-19: Secondary | ICD-10-CM | POA: Diagnosis not present

## 2022-06-13 DIAGNOSIS — J101 Influenza due to other identified influenza virus with other respiratory manifestations: Secondary | ICD-10-CM | POA: Diagnosis not present

## 2022-08-08 ENCOUNTER — Ambulatory Visit (INDEPENDENT_AMBULATORY_CARE_PROVIDER_SITE_OTHER): Payer: BC Managed Care – PPO | Admitting: Behavioral Health

## 2022-08-08 DIAGNOSIS — Z630 Problems in relationship with spouse or partner: Secondary | ICD-10-CM

## 2022-08-08 DIAGNOSIS — F4323 Adjustment disorder with mixed anxiety and depressed mood: Secondary | ICD-10-CM
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
## 2022-08-08 NOTE — Progress Notes (Signed)
                Deborah Stevens [name], LMFT 

## 2022-08-19 ENCOUNTER — Telehealth: Payer: Self-pay | Admitting: Family Medicine

## 2022-08-19 NOTE — Telephone Encounter (Signed)
Called patient due to her scheduling a physical in an OV spot. Patient has been scheduled for CPE on 01/05 @ 10 am.  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 States she would like to make sure nothing significant has changed lab work wise.

## 2022-08-20 ENCOUNTER — Ambulatory Visit (INDEPENDENT_AMBULATORY_CARE_PROVIDER_SITE_OTHER): Payer: BC Managed Care – PPO | Admitting: Family Medicine

## 2022-08-20 ENCOUNTER — Encounter: Payer: Self-pay | Admitting: Family Medicine

## 2022-08-20 VITALS — BP 100/70 | HR 61 | Temp 98.0°F | Ht 69.0 in | Wt 219.8 lb

## 2022-08-20 DIAGNOSIS — L659 Nonscarring hair loss, unspecified: Secondary | ICD-10-CM | POA: Diagnosis not present

## 2022-08-20 DIAGNOSIS — Z86010 Personal history of colonic polyps: Secondary | ICD-10-CM

## 2022-08-20 DIAGNOSIS — M25541 Pain in joints of right hand: Secondary | ICD-10-CM

## 2022-08-20 DIAGNOSIS — E559 Vitamin D deficiency, unspecified: Secondary | ICD-10-CM

## 2022-08-20 DIAGNOSIS — I10 Essential (primary) hypertension: Secondary | ICD-10-CM | POA: Diagnosis not present

## 2022-08-20 DIAGNOSIS — Z860101 Personal history of adenomatous and serrated colon polyps: Secondary | ICD-10-CM

## 2022-08-20 DIAGNOSIS — Z23 Encounter for immunization: Secondary | ICD-10-CM | POA: Diagnosis not present

## 2022-08-20 DIAGNOSIS — Z0000 Encounter for general adult medical examination without abnormal findings: Secondary | ICD-10-CM

## 2022-08-20 LAB — CBC WITH DIFFERENTIAL/PLATELET
Basophils Absolute: 0.1 10*3/uL (ref 0.0–0.1)
Basophils Relative: 0.8 % (ref 0.0–3.0)
Eosinophils Absolute: 0.1 10*3/uL (ref 0.0–0.7)
Eosinophils Relative: 1.2 % (ref 0.0–5.0)
HCT: 39.4 % (ref 36.0–46.0)
Hemoglobin: 12.9 g/dL (ref 12.0–15.0)
Lymphocytes Relative: 39.8 % (ref 12.0–46.0)
Lymphs Abs: 2.5 10*3/uL (ref 0.7–4.0)
MCHC: 32.8 g/dL (ref 30.0–36.0)
MCV: 90.5 fl (ref 78.0–100.0)
Monocytes Absolute: 0.4 10*3/uL (ref 0.1–1.0)
Monocytes Relative: 5.6 % (ref 3.0–12.0)
Neutro Abs: 3.3 10*3/uL (ref 1.4–7.7)
Neutrophils Relative %: 52.6 % (ref 43.0–77.0)
Platelets: 204 10*3/uL (ref 150.0–400.0)
RBC: 4.35 Mil/uL (ref 3.87–5.11)
RDW: 13.8 % (ref 11.5–15.5)
WBC: 6.3 10*3/uL (ref 4.0–10.5)

## 2022-08-20 LAB — VITAMIN D 25 HYDROXY (VIT D DEFICIENCY, FRACTURES): VITD: 12.9 ng/mL — ABNORMAL LOW (ref 30.00–100.00)

## 2022-08-20 LAB — TSH: TSH: 1.12 u[IU]/mL (ref 0.35–5.50)

## 2022-08-20 MED ORDER — DICLOFENAC SODIUM 75 MG PO TBEC: 75.0000 mg | DELAYED_RELEASE_TABLET | Freq: Two times a day (BID) | ORAL | 0 refills | Status: DC
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
## 2022-08-20 NOTE — Patient Instructions (Signed)
Please return in 3 weeks for hand pain, virtual visit  I will release your lab results to you on your MyChart account with further instructions. You may see the results before I do, but when I review them I will send you a message with my report or have my assistant call you if things need to be discussed. Please reply to my message with any questions. Thank you!   If you have any questions or concerns, please don't hesitate to send me a message via MyChart or call the office at [phone]. Thank you for visiting with [place] today! It's our pleasure caring for you.   Please do these things to maintain good health!  Exercise at least 30-45 minutes a day,  4-5 days a week.  Eat a low-fat diet with lots of fruits and vegetables, up to 7-9 servings per day. Drink plenty of water daily. Try to drink 8 8oz glasses per day. Seatbelts can save your life. Always wear your seatbelt. Place Smoke Detectors on every level of your home and check batteries every year. Schedule an appointment with an eye doctor for an eye exam every 1-2 years Safe sex - use condoms to protect yourself from STDs if you could be exposed to these types of infections. Use birth control if you do not want to become pregnant and are sexually active. Avoid heavy alcohol use. If you drink, keep it to less than 2 drinks/day and not every day. Ashdown.  Choose someone you trust that could speak for you if you became unable to speak for yourself. Depression is common in our stressful world.If you're feeling down or losing interest in things you normally enjoy, please come in for a visit. If anyone is threatening or hurting you, please get help. Physical or Emotional Violence is never OK.

## 2022-08-21 ENCOUNTER — Ambulatory Visit: Payer: BC Managed Care – PPO | Admitting: Behavioral Health

## 2022-08-21 LAB — COMPREHENSIVE METABOLIC PANEL
ALT: 16 U/L (ref 0–35)
AST: 20 U/L (ref 0–37)
Albumin: 4.1 g/dL (ref 3.5–5.2)
Alkaline Phosphatase: 81 U/L (ref 39–117)
BUN: 10 mg/dL (ref 6–23)
CO2: 27 mEq/L (ref 19–32)
Calcium: 9.7 mg/dL (ref 8.4–10.5)
Chloride: 104 mEq/L (ref 96–112)
Creatinine, Ser: 0.82 mg/dL (ref 0.40–1.20)
GFR: 80.2 mL/min (ref >60.00)
Glucose, Bld: 90 mg/dL (ref 70–99)
Potassium: 4.2 mEq/L (ref 3.5–5.1)
Sodium: 142 mEq/L (ref 135–145)
Total Bilirubin: 0.4 mg/dL (ref 0.2–1.2)
Total Protein: 7.6 g/dL (ref 6.0–8.3)

## 2022-08-21 LAB — LIPID PANEL
Cholesterol: 180 mg/dL (ref 0–200)
HDL: 65.3 mg/dL (ref >39.00)
LDL Cholesterol: 94 mg/dL (ref 0–99)
NonHDL: 114.76
Total CHOL/HDL Ratio: 3
Triglycerides: 104 mg/dL (ref 0.0–149.0)
VLDL: 20.8 mg/dL (ref 0.0–40.0)

## 2022-08-23 LAB — ARTHRITIS PANEL
Anti Nuclear Antibody (ANA): NEGATIVE
Rheumatoid fact SerPl-aCnc: <10 IU/mL (ref <14.0)
Uric Acid: 5.6 mg/dL (ref 3.0–7.2)

## 2022-08-31 ENCOUNTER — Other Ambulatory Visit: Payer: Self-pay | Admitting: Family Medicine

## 2022-09-02 ENCOUNTER — Ambulatory Visit: Payer: BC Managed Care – PPO | Admitting: Family Medicine

## 2022-09-03 ENCOUNTER — Ambulatory Visit (INDEPENDENT_AMBULATORY_CARE_PROVIDER_SITE_OTHER): Payer: BC Managed Care – PPO | Admitting: Behavioral Health

## 2022-09-03 DIAGNOSIS — F4323 Adjustment disorder with mixed anxiety and depressed mood: Secondary | ICD-10-CM | POA: Diagnosis not present

## 2022-09-03 NOTE — Progress Notes (Signed)
                Shyasia Funches L Celsa Nordahl, LMFT 
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
## 2022-09-03 NOTE — Addendum Note (Signed)
Addended by: Donnetta Hutching on: 09/03/2022 03:21 PM   Modules accepted: Level of Service

## 2022-09-06 ENCOUNTER — Telehealth (INDEPENDENT_AMBULATORY_CARE_PROVIDER_SITE_OTHER): Payer: BC Managed Care – PPO | Admitting: Family Medicine

## 2022-09-06 VITALS — Ht 69.0 in | Wt 217.0 lb

## 2022-09-06 DIAGNOSIS — M25541 Pain in joints of right hand: Secondary | ICD-10-CM | POA: Diagnosis not present

## 2022-09-06 MED ORDER — PREDNISONE 10 MG PO TABS: ORAL_TABLET | ORAL | 0 refills | Status: DC

## 2022-09-06 NOTE — Patient Instructions (Signed)
Please follow up if symptoms do not improve or as needed.    Please go to our [facility] office to get your xrays done. You can walk in M-F between 8:30am- noon or 1pm - 5pm. Tell them you are there for xrays ordered by me. They will send me the results, then I will let you know the results with instructions.   Address: [address]  The Xray department is located in the basement.   
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
## 2022-09-09 ENCOUNTER — Ambulatory Visit (INDEPENDENT_AMBULATORY_CARE_PROVIDER_SITE_OTHER)
Admission: RE | Admit: 2022-09-09 | Discharge: 2022-09-09 | Disposition: A | Discharge status: Home / Self Care | Payer: BC Managed Care – PPO | Source: Ambulatory Visit | Attending: Family Medicine | Admitting: Family Medicine

## 2022-09-09 DIAGNOSIS — M7989 Other specified soft tissue disorders: Secondary | ICD-10-CM | POA: Diagnosis not present

## 2022-09-09 DIAGNOSIS — M25541 Pain in joints of right hand: Secondary | ICD-10-CM | POA: Diagnosis not present

## 2022-09-11 ENCOUNTER — Encounter: Payer: Self-pay | Admitting: Family Medicine

## 2022-09-11 ENCOUNTER — Other Ambulatory Visit: Payer: Self-pay

## 2022-09-11 DIAGNOSIS — M19049 Primary osteoarthritis, unspecified hand: Secondary | ICD-10-CM

## 2022-09-11 DIAGNOSIS — M25541 Pain in joints of right hand: Secondary | ICD-10-CM

## 2022-09-11 NOTE — Progress Notes (Signed)
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 thanks

## 2022-09-13 DIAGNOSIS — M65839 Other synovitis and tenosynovitis, unspecified forearm: Secondary | ICD-10-CM | POA: Diagnosis not present

## 2022-09-13 DIAGNOSIS — M79641 Pain in right hand: Secondary | ICD-10-CM | POA: Diagnosis not present

## 2022-09-25 ENCOUNTER — Ambulatory Visit: Payer: BC Managed Care – PPO | Admitting: Behavioral Health

## 2022-09-25 DIAGNOSIS — Z630 Problems in relationship with spouse or partner: Secondary | ICD-10-CM | POA: Diagnosis not present

## 2022-09-25 DIAGNOSIS — F4323 Adjustment disorder with mixed anxiety and depressed mood: Secondary | ICD-10-CM | POA: Diagnosis not present
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
## 2022-09-25 NOTE — Progress Notes (Signed)
                Deborah Stevens Terianne Thaker, LMFT 

## 2022-09-25 NOTE — Addendum Note (Signed)
Addended by: Donnetta Hutching on: 09/25/2022 01:51 PM   Modules accepted: Level of Service

## 2022-10-10 ENCOUNTER — Ambulatory Visit: Payer: BC Managed Care – PPO | Admitting: Behavioral Health

## 2022-10-10 DIAGNOSIS — F4323 Adjustment disorder with mixed anxiety and depressed mood: Secondary | ICD-10-CM

## 2022-10-10 DIAGNOSIS — Z630 Problems in relationship with spouse or partner: Secondary | ICD-10-CM

## 2022-10-10 NOTE — Progress Notes (Signed)
                Evann Koelzer L Bricia Taher, LMFT 
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
## 2022-10-22 DIAGNOSIS — M65831 Other synovitis and tenosynovitis, right forearm: Secondary | ICD-10-CM | POA: Diagnosis not present

## 2022-10-30 ENCOUNTER — Ambulatory Visit: Payer: BC Managed Care – PPO | Admitting: Behavioral Health

## 2022-11-09 DIAGNOSIS — M25531 Pain in right wrist: Secondary | ICD-10-CM | POA: Diagnosis not present

## 2022-11-12 DIAGNOSIS — M65831 Other synovitis and tenosynovitis, right forearm: Secondary | ICD-10-CM | POA: Diagnosis not present

## 2022-11-19 ENCOUNTER — Ambulatory Visit (HOSPITAL_COMMUNITY)
Admission: EM | Admit: 2022-11-19 | Discharge: 2022-11-19 | Disposition: A | Discharge status: Home / Self Care | Payer: BC Managed Care – PPO | Attending: Physician Assistant | Admitting: Physician Assistant

## 2022-11-19 ENCOUNTER — Telehealth: Payer: Self-pay | Admitting: Family Medicine

## 2022-11-19 ENCOUNTER — Encounter (HOSPITAL_COMMUNITY): Payer: Self-pay

## 2022-11-19 DIAGNOSIS — R197 Diarrhea, unspecified: Secondary | ICD-10-CM | POA: Diagnosis not present

## 2022-11-19 DIAGNOSIS — R112 Nausea with vomiting, unspecified: Secondary | ICD-10-CM | POA: Diagnosis not present

## 2022-11-19 MED ORDER — ONDANSETRON 4 MG PO TBDP
ORAL_TABLET | ORAL | Status: AC
  Filled 2022-11-19: qty 1

## 2022-11-19 MED ORDER — ONDANSETRON HCL 4 MG PO TABS: 4.0000 mg | ORAL_TABLET | Freq: Three times a day (TID) | ORAL | 0 refills | Status: DC | PRN

## 2022-11-19 MED ORDER — ONDANSETRON 4 MG PO TBDP
4.0000 mg | ORAL_TABLET | Freq: Once | ORAL | Status: AC
  Administered 2022-11-19: 4 mg via ORAL
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
## 2022-11-19 NOTE — Telephone Encounter (Signed)
Patient Name: Deborah Stevens Gender: Female DOB: [birth year] Age: 56 Y 3 M 1 D Return Phone Number: [phone] (Primary), [phone] (Secondary) Address: City/ State/ Zip: [place]  [address] Client [facility] Provider Billey Chang- MD Contact Type Call Who Is Calling Patient / Member / Family / Caregiver Call Type Triage / Clinical Relationship To Patient Self Return Phone Number [phone] (Secondary) Chief Complaint Dizziness Reason for Call Symptomatic / Request for Health Information Initial Comment Since Wednesday she has been having diarrhea, she is dizzy and thirsty, kind of can drive. Translation No Nurse Assessment Nurse: Dawna Part, RN, Jarrett Soho Date/Time Eilene Ghazi Time): 11/19/2022 2:23:32 PM Confirm and document reason for call. If symptomatic, describe symptoms. ---Caller reports having dizziness, headache Thursday, diarrhea x3 and vomiting x2 started Friday Does the patient have any new or worsening symptoms? ---Yes Will a triage be completed? ---Yes Related visit to physician within the last 2 weeks? ---No Does the PT have any chronic conditions? (i.e. diabetes, asthma, this includes High risk factors for pregnancy, etc.) ---No Is this a behavioral health or substance abuse call? ---No Guidelines Guideline Title Affirmed Question Affirmed Notes Nurse Date/Time (Eastern Time) Diarrhea [1] Constant abdominal pain AND [2] present > 2 hours Dawna Part, RN, Jarrett Soho 11/19/2022 2:25:57 PM Disp. Time Eilene Ghazi Time) Disposition Final User 11/19/2022 2:06:44 PM Attempt made - message left Donnamae Jude 11/19/2022 2:32:11 PM See HCP within 4 Hours (or PCP triage) Yes Dawna Part, RN, Jarrett Soho  Final Disposition 11/19/2022 2:32:11 PM See HCP within 4 Hours (or PCP triage) Yes Dawna Part, RN, Hayes Ludwig Disagree/Comply Comply Caller Understands Yes PreDisposition  Call Doctor Care Advice Given Per Guideline SEE HCP (OR PCP TRIAGE) WITHIN 4 HOURS: * IF OFFICE WILL BE OPEN: You need to be seen within the next 3 or 4 hours. Call your doctor (or NP/PA) now or as soon as the office opens. CALL BACK IF: * You become worse CARE ADVICE given per Diarrhea (Adult) guideline. * OFFICE: If patient sounds stable and not seriously ill, consult PCP (or follow your office policy) to see if patient can be seen NOW in office. Referrals [facility] [place]

## 2022-11-19 NOTE — ED Triage Notes (Signed)
Pt c/o n/v/d with lower abdominal cramping x3 days with weakness, headaches, and dry mouth. Denies taking meds for sx's. States v/d x3 today. States able to keep fluids down.

## 2022-11-19 NOTE — Telephone Encounter (Signed)
Pt states: -Has had diarrhea since 11/13/22 -Dizzy and thirsty -Able to drive very slowly -Has appt with PCP team scheduled for 11/22/22 but called because she was not sure what to do.   Warm transferred to [facility], patient coordinator for PCP Triage / Team Health Nurse

## 2022-11-19 NOTE — Discharge Instructions (Addendum)
Take zofran as needed for nausea Recommend Imodium as needed for diarrhea Drink plenty of fluids, can drink gatorade or pedialyte Return if you develop new or worsening symptoms

## 2022-11-22 ENCOUNTER — Ambulatory Visit: Payer: BC Managed Care – PPO | Admitting: Family Medicine

## 2022-11-29 ENCOUNTER — Other Ambulatory Visit: Payer: Self-pay

## 2022-11-29 ENCOUNTER — Emergency Department (HOSPITAL_COMMUNITY)
Admission: EM | Admit: 2022-11-29 | Discharge: 2022-11-30 | Disposition: A | Discharge status: Home / Self Care | Payer: BC Managed Care – PPO | Attending: Emergency Medicine | Admitting: Emergency Medicine

## 2022-11-29 ENCOUNTER — Emergency Department (HOSPITAL_COMMUNITY): Payer: BC Managed Care – PPO

## 2022-11-29 DIAGNOSIS — R634 Abnormal weight loss: Secondary | ICD-10-CM | POA: Insufficient documentation

## 2022-11-29 DIAGNOSIS — R197 Diarrhea, unspecified: Secondary | ICD-10-CM | POA: Diagnosis not present

## 2022-11-29 DIAGNOSIS — R109 Unspecified abdominal pain: Secondary | ICD-10-CM | POA: Diagnosis not present

## 2022-11-29 DIAGNOSIS — K760 Fatty (change of) liver, not elsewhere classified: Secondary | ICD-10-CM | POA: Diagnosis not present

## 2022-11-29 DIAGNOSIS — R1030 Lower abdominal pain, unspecified: Secondary | ICD-10-CM | POA: Diagnosis not present

## 2022-11-29 DIAGNOSIS — R1110 Vomiting, unspecified: Secondary | ICD-10-CM

## 2022-11-29 DIAGNOSIS — E876 Hypokalemia: Secondary | ICD-10-CM | POA: Diagnosis not present

## 2022-11-29 LAB — I-STAT CHEM 8, ED
BUN: 18 mg/dL (ref 6–20)
Calcium, Ion: 1.18 mmol/L (ref 1.15–1.40)
Chloride: 95 mmol/L — ABNORMAL LOW (ref 98–111)
Creatinine, Ser: 1.4 mg/dL — ABNORMAL HIGH (ref 0.44–1.00)
Glucose, Bld: 106 mg/dL — ABNORMAL HIGH (ref 70–99)
HCT: 42 % (ref 36.0–46.0)
Hemoglobin: 14.3 g/dL (ref 12.0–15.0)
Potassium: 2.8 mmol/L — ABNORMAL LOW (ref 3.5–5.1)
Sodium: 138 mmol/L (ref 135–145)
TCO2: 31 mmol/L (ref 22–32)

## 2022-11-29 LAB — CBC WITH DIFFERENTIAL/PLATELET
Abs Immature Granulocytes: 0.02 10*3/uL (ref 0.00–0.07)
Basophils Absolute: 0 10*3/uL (ref 0.0–0.1)
Basophils Relative: 1 %
Eosinophils Absolute: 0.1 10*3/uL (ref 0.0–0.5)
Eosinophils Relative: 1 %
HCT: 41 % (ref 36.0–46.0)
Hemoglobin: 13.7 g/dL (ref 12.0–15.0)
Immature Granulocytes: 0 %
Lymphocytes Relative: 35 %
Lymphs Abs: 3 10*3/uL (ref 0.7–4.0)
MCH: 29.8 pg (ref 26.0–34.0)
MCHC: 33.4 g/dL (ref 30.0–36.0)
MCV: 89.1 fL (ref 80.0–100.0)
Monocytes Absolute: 0.4 10*3/uL (ref 0.1–1.0)
Monocytes Relative: 5 %
Neutro Abs: 4.9 10*3/uL (ref 1.7–7.7)
Neutrophils Relative %: 58 %
Platelets: 249 10*3/uL (ref 150–400)
RBC: 4.6 MIL/uL (ref 3.87–5.11)
RDW: 12.6 % (ref 11.5–15.5)
WBC: 8.4 10*3/uL (ref 4.0–10.5)
nRBC: 0 % (ref 0.0–0.2)

## 2022-11-29 LAB — URINALYSIS, ROUTINE W REFLEX MICROSCOPIC
Bilirubin Urine: NEGATIVE
Glucose, UA: NEGATIVE mg/dL
Ketones, ur: NEGATIVE mg/dL
Leukocytes,Ua: NEGATIVE
Nitrite: NEGATIVE
Protein, ur: NEGATIVE mg/dL
Specific Gravity, Urine: 1.01 (ref 1.005–1.030)
pH: 5 (ref 5.0–8.0)

## 2022-11-29 MED ORDER — POTASSIUM CHLORIDE CRYS ER 20 MEQ PO TBCR: 20.0000 meq | EXTENDED_RELEASE_TABLET | Freq: Every day | ORAL | 0 refills | Status: DC

## 2022-11-29 MED ORDER — IOHEXOL 350 MG/ML SOLN
50.0000 mL | Freq: Once | INTRAVENOUS | Status: AC | PRN
  Administered 2022-11-29: 50 mL via INTRAVENOUS

## 2022-11-29 MED ORDER — SODIUM CHLORIDE 0.9 % IV BOLUS
1000.0000 mL | Freq: Once | INTRAVENOUS | Status: AC
  Administered 2022-11-29: 1000 mL via INTRAVENOUS

## 2022-11-29 MED ORDER — POTASSIUM CHLORIDE 10 MEQ/100ML IV SOLN
10.0000 meq | INTRAVENOUS | Status: AC
  Administered 2022-11-29 (×3): 10 meq via INTRAVENOUS
  Filled 2022-11-29 (×3): qty 100

## 2022-11-29 MED ORDER — ONDANSETRON 4 MG PO TBDP
4.0000 mg | ORAL_TABLET | Freq: Once | ORAL | Status: AC
  Administered 2022-11-29: 4 mg via ORAL
  Filled 2022-11-29: qty 1

## 2022-11-29 MED ORDER — ONDANSETRON 4 MG PO TBDP: ORAL_TABLET | ORAL | 0 refills | Status: DC

## 2022-11-29 MED ORDER — POTASSIUM CHLORIDE CRYS ER 20 MEQ PO TBCR
40.0000 meq | EXTENDED_RELEASE_TABLET | Freq: Once | ORAL | Status: AC
  Administered 2022-11-29: 40 meq via ORAL
  Filled 2022-11-29: qty 2

## 2022-11-29 NOTE — ED Triage Notes (Signed)
Patient reports emesis /diarrhea for several days with mild dizziness/lightheaded and fatigue .

## 2022-11-29 NOTE — ED Notes (Signed)
Patient unable to provide stool sample at this time. Patient able to tolerate PO water and pills at this time.

## 2022-11-29 NOTE — Discharge Instructions (Addendum)
Your potassium is low likely from vomiting and diarrhea.  I have prescribed potassium tablets daily for a week  I have also prescribed Zofran as needed for nausea.  You need a repeat potassium level in a week.  You also need to call Dr. Celesta Aver office for follow-up and you may need another colonoscopy.  You can take imodium up to 10 times a day for diarrhea   Return to ER if you have worse abdominal pain, vomiting, dehydration

## 2022-11-29 NOTE — ED Provider Triage Note (Signed)
Emergency Medicine Provider Triage Evaluation Note  Delailah Milderd Manocchio , a 56 y.o. female  was evaluated in triage.  Pt complains of emesis, diarrhea, abdominal pain.  Patient reports this began on November 22.  The patient states that since this time she has had intermittent constipation and intermittent diarrhea.  Patient reports history of gallbladder surgery in 2015.  The patient states that now her main issue is constant diarrhea along with nausea.  Patient denies any fevers.  Patient denies any dysuria or flank pain.  Review of Systems  Positive:  Negative:   Physical Exam  BP (!) 151/97 (BP Location: Right Arm)   Pulse 95   Temp 98.4 F (36.9 C)   Resp 16   LMP 09/03/2017   SpO2 100%  Gen:   Awake, no distress  Resp:  Normal effort  MSK:   Moves extremities without difficulty  Other:    Medical Decision Making  Medically screening exam initiated at 7:20 PM.  Appropriate orders placed.  Irina Claude Swendsen was informed that the remainder of the evaluation will be completed by another provider, this initial triage assessment does not replace that evaluation, and the importance of remaining in the ED until their evaluation is complete.     Azucena Cecil, PA-C 11/29/22 1921

## 2022-11-29 NOTE — ED Provider Notes (Signed)
[facility] EMERGENCY DEPARTMENT Provider Note   CSN: [id number] Arrival date & time: 11/29/22  1853     History  Chief Complaint  Patient presents with   Emesis / Diarrhea    Deborah Stevens is a 56 y.o. female presenting with diarrhea and vomiting and weight loss.  She states that since November 22, she has been having vomiting and diarrhea.  She states that she has 7-8 episodes of diarrhea a day and also has intermittent vomiting and diarrhea.  She also lost about 14 pounds.  She had previous colonoscopy in 2018 that was unremarkable    The history is provided by the patient.       Home Medications Prior to Admission medications   Medication Sig Start Date End Date Taking? Authorizing Provider  cetirizine (ZYRTEC) 10 MG tablet TAKE 1 TABLET BY MOUTH EVERY DAY 05/14/22   Leamon Arnt, MD  diclofenac (VOLTAREN) 75 MG EC tablet TAKE 1 TABLET BY MOUTH TWICE A DAY 09/02/22   Leamon Arnt, MD  KLOR-CON M20 20 MEQ tablet TAKE 1 TABLET BY MOUTH EVERY DAY 06/03/22   Leamon Arnt, MD  ondansetron (ZOFRAN) 4 MG tablet Take 1 tablet (4 mg total) by mouth every 8 (eight) hours as needed for nausea or vomiting. 11/19/22   Ward, Lenise Arena, PA-C  triamcinolone cream (KENALOG) 0.1 % APPLY TO AFFECTED AREA(S) TWICE DAILY FOR 2 WEEKS THEN AS NEEDED 05/28/21   Leamon Arnt, MD  triamterene-hydrochlorothiazide [facility]) 37.5-25 MG tablet TAKE 1 TABLET BY MOUTH EVERY DAY 05/21/22   Leamon Arnt, MD      Allergies    Patient has no known allergies.    Review of Systems   Review of Systems  Gastrointestinal:  Positive for abdominal pain, diarrhea and vomiting.  All other systems reviewed and are negative.   Physical Exam Updated Vital Signs BP (!) 151/97 (BP Location: Right Arm)   Pulse 95   Temp 98.4 F (36.9 C)   Resp 16   LMP 09/03/2017   SpO2 100%  Physical Exam Vitals and nursing note reviewed.  Constitutional:      Appearance: Normal  appearance.  HENT:     Head: Normocephalic.     Nose: Nose normal.     Mouth/Throat:     Mouth: Mucous membranes are dry.  Eyes:     Extraocular Movements: Extraocular movements intact.     Pupils: Pupils are equal, round, and reactive to light.  Cardiovascular:     Rate and Rhythm: Normal rate and regular rhythm.     Pulses: Normal pulses.     Heart sounds: Normal heart sounds.  Pulmonary:     Effort: Pulmonary effort is normal.     Breath sounds: Normal breath sounds.  Abdominal:     General: Abdomen is flat.     Palpations: Abdomen is soft.  Musculoskeletal:        General: Normal range of motion.     Cervical back: Normal range of motion and neck supple.  Skin:    General: Skin is warm.     Capillary Refill: Capillary refill takes less than 2 seconds.  Neurological:     General: No focal deficit present.     Mental Status: She is alert and oriented to person, place, and time.  Psychiatric:        Mood and Affect: Mood normal.        Behavior: Behavior normal.  ED Results / Procedures / Treatments   Labs (all labs ordered are listed, but only abnormal results are displayed) Labs Reviewed  URINALYSIS, ROUTINE W REFLEX MICROSCOPIC - Abnormal; Notable for the following components:      Result Value   Hgb urine dipstick SMALL (*)    Bacteria, UA RARE (*)    All other components within normal limits  I-STAT CHEM 8, ED - Abnormal; Notable for the following components:   Potassium 2.8 (*)    Chloride 95 (*)    Creatinine, Ser 1.40 (*)    Glucose, Bld 106 (*)    All other components within normal limits  CBC WITH DIFFERENTIAL/PLATELET  COMPREHENSIVE METABOLIC PANEL  LIPASE, BLOOD    EKG None  Radiology CT ABDOMEN PELVIS W CONTRAST  Result Date: 11/29/2022 CLINICAL DATA:  Emesis and diarrhea for several days. Abdominal pain. EXAM: CT ABDOMEN AND PELVIS WITH CONTRAST TECHNIQUE: Multidetector CT imaging of the abdomen and pelvis was performed using the standard  protocol following bolus administration of intravenous contrast. RADIATION DOSE REDUCTION: This exam was performed according to the departmental dose-optimization program which includes automated exposure control, adjustment of the mA and/or kV according to patient size and/or use of iterative reconstruction technique. CONTRAST:  55m OMNIPAQUE IOHEXOL 350 MG/ML SOLN COMPARISON:  06/19/2019. FINDINGS: Lower chest: No acute abnormality. Hepatobiliary: No focal liver abnormality is seen. There is fatty infiltration of the liver. Status post cholecystectomy. No biliary dilatation. Pancreas: Unremarkable. No pancreatic ductal dilatation or surrounding inflammatory changes. Spleen: Normal in size without focal abnormality. Adrenals/Urinary Tract: No adrenal nodule or mass. The kidneys enhance symmetrically. No renal calculus or hydronephrosis. No renal calculus or hydronephrosis. The bladder is unremarkable. Stomach/Bowel: Stomach is within normal limits. Appendix appears normal. No evidence of bowel wall thickening, distention, or inflammatory changes. No free air or pneumatosis. A few scattered diverticula are present along the colon without evidence of diverticulitis. Vascular/Lymphatic: No significant vascular findings are present. No enlarged abdominal or pelvic lymph nodes. Reproductive: Uterus and bilateral adnexa are unremarkable. Other: No abdominopelvic ascites. Small fat containing umbilical hernia. Musculoskeletal: No acute or suspicious osseous abnormality. IMPRESSION: 1. No acute intra-abdominal process. 2. Hepatic steatosis. Electronically Signed   By: LBrett FairyM.D.   On: 11/29/2022 21:47    Procedures Procedures    Medications Ordered in ED Medications  potassium chloride 10 mEq in 100 mL IVPB (10 mEq Intravenous New Bag/Given 11/29/22 2119)  potassium chloride SA (KLOR-CON M) CR tablet 40 mEq (has no administration in time range)  ondansetron (ZOFRAN-ODT) disintegrating tablet 4 mg (4 mg  Oral Given 11/29/22 1924)  sodium chloride 0.9 % bolus 1,000 mL (1,000 mLs Intravenous New Bag/Given 11/29/22 2102)  iohexol (OMNIPAQUE) 350 MG/ML injection 50 mL (50 mLs Intravenous Contrast Given 11/29/22 2137)    ED Course/ Medical Decision Making/ A&P                           Medical Decision Making Susann RTedi Hughsonis a 56y.o. female here with abdominal pain and vomiting and diarrhea.  She also had a 14 pound weight loss.  Consider GI mass versus colitis versus persistent gastroenteritis versus IBD versus IBS.  Plan to get CBC and CMP and CT abdomen pelvis.  10:24 PM Patient's potassium is 2.8 and were supplemented.  CT ab/pel showed no mass and no colitis.  I wonder if she has IBS.  At this point, patient can be discharged home with  potassium supplementation as well as Imodium.  Patient had previous endoscopy with Dr. Leroy Kennedy so will refer back to  GI.    Problems Addressed: Abdominal cramping: acute illness or injury Hypokalemia: acute illness or injury Vomiting and diarrhea: acute illness or injury  Amount and/or Complexity of Data Reviewed Labs: ordered. Decision-making details documented in ED Course. Radiology: ordered and independent interpretation performed. Decision-making details documented in ED Course.  Risk Prescription drug management.    Final Clinical Impression(s) / ED Diagnoses Final diagnoses:  None    Rx / DC Orders ED Discharge Orders     None         Drenda Freeze, MD 11/29/22 2255

## 2022-11-29 NOTE — ED Notes (Signed)
Patient transported to CT in NAD.

## 2022-11-30 ENCOUNTER — Other Ambulatory Visit: Payer: Self-pay | Admitting: Family Medicine

## 2022-11-30 LAB — COMPREHENSIVE METABOLIC PANEL
ALT: 28 U/L (ref 0–44)
AST: 27 U/L (ref 15–41)
Albumin: 4.2 g/dL (ref 3.5–5.0)
Alkaline Phosphatase: 81 U/L (ref 38–126)
Anion gap: 12 (ref 5–15)
BUN: 14 mg/dL (ref 6–20)
CO2: 29 mmol/L (ref 22–32)
Calcium: 10.3 mg/dL (ref 8.9–10.3)
Chloride: 98 mmol/L (ref 98–111)
Creatinine, Ser: 1.09 mg/dL — ABNORMAL HIGH (ref 0.44–1.00)
GFR, Estimated: 60 mL/min — ABNORMAL LOW (ref >60)
Glucose, Bld: 105 mg/dL — ABNORMAL HIGH (ref 70–99)
Potassium: 2.9 mmol/L — ABNORMAL LOW (ref 3.5–5.1)
Sodium: 139 mmol/L (ref 135–145)
Total Bilirubin: 0.5 mg/dL (ref 0.3–1.2)
Total Protein: 8.6 g/dL — ABNORMAL HIGH (ref 6.5–8.1)

## 2022-11-30 LAB — LIPASE, BLOOD: Lipase: 34 U/L (ref 11–51)

## 2022-12-02 ENCOUNTER — Encounter: Payer: Self-pay | Admitting: Nurse Practitioner

## 2022-12-04 ENCOUNTER — Encounter: Payer: Self-pay | Admitting: Family Medicine

## 2022-12-04 ENCOUNTER — Ambulatory Visit (INDEPENDENT_AMBULATORY_CARE_PROVIDER_SITE_OTHER): Payer: BC Managed Care – PPO | Admitting: Family Medicine

## 2022-12-04 VITALS — BP 118/80 | HR 90 | Temp 98.7°F | Ht 69.0 in | Wt 212.4 lb

## 2022-12-04 DIAGNOSIS — E876 Hypokalemia: Secondary | ICD-10-CM

## 2022-12-04 DIAGNOSIS — R197 Diarrhea, unspecified: Secondary | ICD-10-CM

## 2022-12-04 DIAGNOSIS — F4322 Adjustment disorder with anxiety: Secondary | ICD-10-CM

## 2022-12-04 DIAGNOSIS — T502X5A Adverse effect of carbonic-anhydrase inhibitors, benzothiadiazides and other diuretics, initial encounter: Secondary | ICD-10-CM | POA: Diagnosis not present

## 2022-12-04 LAB — BASIC METABOLIC PANEL
BUN: 16 mg/dL (ref 6–23)
CO2: 32 mEq/L (ref 19–32)
Calcium: 10.5 mg/dL (ref 8.4–10.5)
Chloride: 95 mEq/L — ABNORMAL LOW (ref 96–112)
Creatinine, Ser: 1.01 mg/dL (ref 0.40–1.20)
GFR: 62.32 mL/min (ref >60.00)
Glucose, Bld: 99 mg/dL (ref 70–99)
Potassium: 3 mEq/L — ABNORMAL LOW (ref 3.5–5.1)
Sodium: 138 mEq/L (ref 135–145)

## 2022-12-04 NOTE — Addendum Note (Signed)
Addended by: Billey Chang on: 12/04/2022 03:37 PM   Modules accepted: Orders

## 2022-12-04 NOTE — Patient Instructions (Signed)
Please return if your symptoms do not resolve or you need help with your stress.  Restart counseling.   I will release your lab results to you on your MyChart account with further instructions. You may see the results before I do, but when I review them I will send you a message with my report or have my assistant call you if things need to be discussed. Please reply to my message with any questions. Thank you!   If you have any questions or concerns, please don't hesitate to send me a message via MyChart or call the office at [phone]. Thank you for visiting with [place] today! It's our pleasure caring for you.
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
## 2022-12-11 ENCOUNTER — Other Ambulatory Visit (INDEPENDENT_AMBULATORY_CARE_PROVIDER_SITE_OTHER): Payer: BC Managed Care – PPO

## 2022-12-11 DIAGNOSIS — E876 Hypokalemia: Secondary | ICD-10-CM

## 2022-12-11 LAB — BASIC METABOLIC PANEL
BUN: 19 mg/dL (ref 6–23)
CO2: 34 mEq/L — ABNORMAL HIGH (ref 19–32)
Calcium: 10.5 mg/dL (ref 8.4–10.5)
Chloride: 95 mEq/L — ABNORMAL LOW (ref 96–112)
Creatinine, Ser: 1.28 mg/dL — ABNORMAL HIGH (ref 0.40–1.20)
GFR: 46.9 mL/min — ABNORMAL LOW (ref >60.00)
Glucose, Bld: 108 mg/dL — ABNORMAL HIGH (ref 70–99)
Potassium: 3 mEq/L — ABNORMAL LOW (ref 3.5–5.1)
Sodium: 139 mEq/L (ref 135–145)

## 2022-12-11 LAB — MAGNESIUM: Magnesium: 2 mg/dL (ref 1.5–2.5)

## 2022-12-12 ENCOUNTER — Encounter: Payer: Self-pay | Admitting: Family Medicine

## 2022-12-12 ENCOUNTER — Telehealth: Payer: Self-pay | Admitting: Family Medicine

## 2022-12-12 NOTE — Telephone Encounter (Signed)
Patient Name: Deborah Stevens Gender: Female DOB: August 09, 1966 Age: 56 Y 3 M 24 D Return Phone Number: [phone] (Primary), [phone] (Secondary) Address: City/ State/ Zip: [place] [address] Client [facility] Provider Billey Chang- MD Contact Type Call Who Is Calling Patient / Member / Family / Caregiver Call Type Triage / Clinical Relationship To Patient Self Return Phone Number [phone] (Secondary) Chief Complaint Dizziness Reason for Call Symptomatic / Request for Health Information Initial Comment Caller states she has lightheadedness, fatigue, dizziness and dry mouth. She has not had a bowel movement since last Friday and has only urinated once today. She is also very weak. She was diagnosed with low potassium. She changed her diet and is taking 2 potassium pills daily. Her appetite has decreased. This has all been going on since 11/22. Translation No Nurse Assessment Nurse: Ronnell Freshwater, RN, [place] Gave Date/Time Eilene Ghazi Time): 12/12/2022 3:36:04 PM Confirm and document reason for call. If symptomatic, describe symptoms. ---Caller states that she has been feeling weak, uneasy, fatigued, N/V/D since 11/22. Was seen in UC on 11/28. Was constipated the next week, and did not have a bowel movement for six days. Afterward, diarrhea returned. Was seen then in the ED, got fluids and potassium. Seen in clinic following week, and still had hypokalemia. Is still having these symptoms, except vomiting. Denies fever, other symptoms. Does the patient have any new or worsening symptoms? ---Yes Will a triage be completed? ---Yes Related visit to physician within the last 2 weeks? ---Yes Does the PT have any chronic conditions? (i.e. diabetes, asthma, this includes High risk factors for pregnancy, etc.) ---No Is this a behavioral health or substance abuse call?  ---No PLEASE NOTE: All timestamps contained within this report are represented as [place] Standard Time. CONFIDENTIALTY NOTICE: This fax transmission is intended only for the addressee. It contains information that is legally privileged, confidential or otherwise protected from use or disclosure. If you are not the intended recipient, you are strictly prohibited from reviewing, disclosing, copying using or disseminating any of this information or taking any action in reliance on or regarding this information. If you have received this fax in error, please notify us immediately by telephone so that we can arrange for its return to [place]. Phone: [phone], Toll-Free: [phone], Fax: [phone] Page: 2 of 2 Call Id: [id number] Guidelines Guideline Title Affirmed Question Affirmed Notes Nurse Date/Time Eilene Ghazi Time) Dizziness - Lightheadedness [1] Drinking very little AND [2] dehydration suspected (e.g., no urine > 12 hours, very dry mouth, very lightheaded) Ronnell Freshwater, RN, [place] Gave 12/12/2022 3:41:16 PM Disp. Time Eilene Ghazi Time) Disposition Final User 12/12/2022 3:48:26 PM Go to ED Now (or PCP triage) Yes Ronnell Freshwater, RN, Harrell Gave Final Disposition 12/12/2022 3:48:26 PM Go to ED Now (or PCP triage) Yes Ronnell Freshwater, RN, Eusebio Friendly Disagree/Comply Comply Caller Understands Yes PreDisposition Call Doctor Care Advice Given Per Guideline GO TO ED NOW (OR PCP TRIAGE): * IF NO PCP (PRIMARY CARE PROVIDER) SECOND-LEVEL TRIAGE: You need to be seen within the next hour. Go to the [facility] at _____________ [place] as soon as you can. ANOTHER ADULT SHOULD DRIVE: * It is better and safer if another adult drives instead of you. BRING MEDICINES: * Bring a list of your current medicines when you go to the Emergency Department (ER). * Bring the pill bottles too. This will help the doctor (or NP/PA) to make certain you are taking the right medicines and the  right dose. CARE ADVICE given  per Dizziness (Adult) guideline. Referrals REFERRED TO PCP OFFICE [facility] - ED

## 2022-12-12 NOTE — Telephone Encounter (Signed)
Patient states: - Recent lab results reflect how bad she feels  - Currently experiencing lightheadedness/dizziness, fatigue, and dry mouth  - No BM since 12/06/22; Usually pees 5-6 times a day, now peeing 1-2 times daily   Patient has been transferred to triage.

## 2022-12-13 ENCOUNTER — Other Ambulatory Visit: Payer: Self-pay

## 2022-12-13 ENCOUNTER — Emergency Department (HOSPITAL_COMMUNITY)
Admission: EM | Admit: 2022-12-13 | Discharge: 2022-12-14 | Disposition: A | Discharge status: Home / Self Care | Payer: BC Managed Care – PPO | Attending: Emergency Medicine | Admitting: Emergency Medicine

## 2022-12-13 ENCOUNTER — Encounter (HOSPITAL_COMMUNITY): Payer: Self-pay

## 2022-12-13 ENCOUNTER — Emergency Department (HOSPITAL_COMMUNITY): Payer: BC Managed Care – PPO

## 2022-12-13 DIAGNOSIS — E876 Hypokalemia: Secondary | ICD-10-CM | POA: Diagnosis not present

## 2022-12-13 DIAGNOSIS — K5900 Constipation, unspecified: Secondary | ICD-10-CM | POA: Diagnosis not present

## 2022-12-13 DIAGNOSIS — R531 Weakness: Secondary | ICD-10-CM | POA: Insufficient documentation

## 2022-12-13 DIAGNOSIS — M65831 Other synovitis and tenosynovitis, right forearm: Secondary | ICD-10-CM | POA: Diagnosis not present

## 2022-12-13 DIAGNOSIS — R0789 Other chest pain: Secondary | ICD-10-CM | POA: Diagnosis not present

## 2022-12-13 DIAGNOSIS — R079 Chest pain, unspecified: Secondary | ICD-10-CM | POA: Diagnosis not present

## 2022-12-13 LAB — CBC WITH DIFFERENTIAL/PLATELET
Abs Immature Granulocytes: 0.01 10*3/uL (ref 0.00–0.07)
Basophils Absolute: 0 10*3/uL (ref 0.0–0.1)
Basophils Relative: 0 %
Eosinophils Absolute: 0.1 10*3/uL (ref 0.0–0.5)
Eosinophils Relative: 2 %
HCT: 40.4 % (ref 36.0–46.0)
Hemoglobin: 13.4 g/dL (ref 12.0–15.0)
Immature Granulocytes: 0 %
Lymphocytes Relative: 41 %
Lymphs Abs: 2.9 10*3/uL (ref 0.7–4.0)
MCH: 30.5 pg (ref 26.0–34.0)
MCHC: 33.2 g/dL (ref 30.0–36.0)
MCV: 92 fL (ref 80.0–100.0)
Monocytes Absolute: 0.4 10*3/uL (ref 0.1–1.0)
Monocytes Relative: 5 %
Neutro Abs: 3.7 10*3/uL (ref 1.7–7.7)
Neutrophils Relative %: 52 %
Platelets: ADEQUATE 10*3/uL (ref 150–400)
RBC: 4.39 MIL/uL (ref 3.87–5.11)
RDW: 12.6 % (ref 11.5–15.5)
WBC: 7.1 10*3/uL (ref 4.0–10.5)
nRBC: 0 % (ref 0.0–0.2)

## 2022-12-13 LAB — TROPONIN I (HIGH SENSITIVITY)
Troponin I (High Sensitivity): 4 ng/L (ref <18)
Troponin I (High Sensitivity): 5 ng/L (ref <18)

## 2022-12-13 LAB — COMPREHENSIVE METABOLIC PANEL
ALT: 27 U/L (ref 0–44)
AST: 28 U/L (ref 15–41)
Albumin: 3.8 g/dL (ref 3.5–5.0)
Alkaline Phosphatase: 73 U/L (ref 38–126)
Anion gap: 10 (ref 5–15)
BUN: 19 mg/dL (ref 6–20)
CO2: 30 mmol/L (ref 22–32)
Calcium: 9.7 mg/dL (ref 8.9–10.3)
Chloride: 100 mmol/L (ref 98–111)
Creatinine, Ser: 1.2 mg/dL — ABNORMAL HIGH (ref 0.44–1.00)
GFR, Estimated: 53 mL/min — ABNORMAL LOW (ref >60)
Glucose, Bld: 105 mg/dL — ABNORMAL HIGH (ref 70–99)
Potassium: 2.7 mmol/L — CL (ref 3.5–5.1)
Sodium: 140 mmol/L (ref 135–145)
Total Bilirubin: 0.6 mg/dL (ref 0.3–1.2)
Total Protein: 7.2 g/dL (ref 6.5–8.1)

## 2022-12-13 LAB — URINALYSIS, ROUTINE W REFLEX MICROSCOPIC
Bacteria, UA: NONE SEEN
Bilirubin Urine: NEGATIVE
Glucose, UA: NEGATIVE mg/dL
Hgb urine dipstick: NEGATIVE
Ketones, ur: NEGATIVE mg/dL
Nitrite: NEGATIVE
Protein, ur: NEGATIVE mg/dL
Specific Gravity, Urine: 1.019 (ref 1.005–1.030)
pH: 5 (ref 5.0–8.0)

## 2022-12-13 MED ORDER — POTASSIUM CHLORIDE CRYS ER 20 MEQ PO TBCR: 20.0000 meq | EXTENDED_RELEASE_TABLET | Freq: Two times a day (BID) | ORAL | 0 refills | Status: DC

## 2022-12-13 NOTE — ED Provider Triage Note (Signed)
Emergency Medicine Provider Triage Evaluation Note  Lama Arnice Vanepps , a 56 y.o. female  was evaluated in triage.  Pt complains of constipation x 1 week, decreased urination, nausea, diarrhea, weakness and fatigue.  Patient has had issues with hypokalemia for the past 30 days, despite taking daily potassium supplements.  She states now she has chest pain and upper back pain.  Denies shortness of breath, syncope, fever, chills, vomiting, diarrhea.  Review of Systems  Positive: As above Negative: As above  Physical Exam  BP (!) 142/87 (BP Location: Right Arm)   Pulse 78   Temp 98.1 F (36.7 C) (Oral)   Resp 20   Ht '5\' 9"'$  (1.753 m)   Wt 93.4 kg   LMP 09/03/2017   SpO2 99%   BMI 30.42 kg/m  Gen:   Awake, no distress   Resp:  Normal effort  MSK:   Moves extremities without difficulty  Other:    Medical Decision Making  Medically screening exam initiated at 5:17 PM.  Appropriate orders placed.  Ellaina Danisha Brassfield was informed that the remainder of the evaluation will be completed by another provider, this initial triage assessment does not replace that evaluation, and the importance of remaining in the ED until their evaluation is complete.     Theressa Stamps R, [place] 12/13/22 [phone]

## 2022-12-13 NOTE — Addendum Note (Signed)
Addended by: Billey Chang on: 12/13/2022 12:12 PM   Modules accepted: Orders

## 2022-12-13 NOTE — ED Triage Notes (Signed)
Pt has had 30 days of not feeling like eating, has had decreased urination in spite of drinking fluids, and has been constipated x 1 week. Pt denies fevers.  Pt c/o weakness and fatigue. Pt c/o chest pain and upper back pain.

## 2022-12-14 ENCOUNTER — Emergency Department (HOSPITAL_COMMUNITY): Payer: BC Managed Care – PPO

## 2022-12-14 DIAGNOSIS — K5900 Constipation, unspecified: Secondary | ICD-10-CM | POA: Diagnosis not present

## 2022-12-14 LAB — MAGNESIUM: Magnesium: 2.1 mg/dL (ref 1.7–2.4)

## 2022-12-14 MED ORDER — POTASSIUM CHLORIDE CRYS ER 20 MEQ PO TBCR
60.0000 meq | EXTENDED_RELEASE_TABLET | Freq: Once | ORAL | Status: AC
  Administered 2022-12-14: 60 meq via ORAL
  Filled 2022-12-14: qty 3

## 2022-12-14 MED ORDER — POTASSIUM CHLORIDE CRYS ER 20 MEQ PO TBCR: 40.0000 meq | EXTENDED_RELEASE_TABLET | Freq: Once | ORAL | Status: DC

## 2022-12-14 MED ORDER — POTASSIUM CHLORIDE ER 20 MEQ PO TBCR: 40.0000 meq | EXTENDED_RELEASE_TABLET | Freq: Two times a day (BID) | ORAL | 0 refills | Status: DC

## 2022-12-14 MED ORDER — SODIUM CHLORIDE 0.9 % IV BOLUS
1000.0000 mL | Freq: Once | INTRAVENOUS | Status: AC
  Administered 2022-12-14: 1000 mL via INTRAVENOUS

## 2022-12-14 MED ORDER — POTASSIUM CHLORIDE 10 MEQ/100ML IV SOLN
10.0000 meq | INTRAVENOUS | Status: AC
  Administered 2022-12-14 (×4): 10 meq via INTRAVENOUS
  Filled 2022-12-14 (×4): qty 100

## 2022-12-14 NOTE — ED Provider Notes (Signed)
[name] EMERGENCY DEPARTMENT Provider Note   CSN: [id number] Arrival date & time: 12/13/22  1621     History  Chief Complaint  Patient presents with   Weakness    Deborah Stevens is a 56 y.o. female, who presents today to the ED secondary to weakness x 1 month.  She states that initially about a month ago she was having bad nausea, vomiting, and diarrhea, lasted for couple weeks, was seen in the ER, and told nothing was wrong.  States that this resolved, and now she has not had a bowel movement for the last week.  She states that she feels very weak, and her potassium keeps going down.  Is any recent med changes, has been on triamterene, hydrochlorothiazide, for the last years.  Also states she takes potassium supplementation, but potassium keeps dropping.  She denies any fever, chills.  Does endorse decreased urination, and suprapubic abdominal pain.  She states that she is also having some chest pain, that is constant under the left breast, not associated with any shortness of breath, and she states it radiates to her back, and left shoulder.  Denies any jaw pain.     Home Medications Prior to Admission medications   Medication Sig Start Date End Date Taking? Authorizing Provider  cetirizine (ZYRTEC) 10 MG tablet TAKE 1 TABLET BY MOUTH EVERY DAY 05/14/22   Leamon Arnt, MD  diclofenac (VOLTAREN) 75 MG EC tablet TAKE 1 TABLET BY MOUTH TWICE A DAY 12/03/22   Leamon Arnt, MD  ondansetron (ZOFRAN) 4 MG tablet Take 1 tablet (4 mg total) by mouth every 8 (eight) hours as needed for nausea or vomiting. 11/19/22   Ward, Lenise Arena, PA-C  Potassium Chloride ER 20 MEQ TBCR Take 40 mEq by mouth 2 (two) times daily for 5 days. 12/14/22 12/19/22  Randie Bloodgood L, PA  potassium chloride SA (KLOR-CON M) 20 MEQ tablet Take 1 tablet (20 mEq total) by mouth 2 (two) times daily. 12/13/22   Leamon Arnt, MD  triamcinolone cream (KENALOG) 0.1 % APPLY TO AFFECTED AREA(S)  TWICE DAILY FOR 2 WEEKS THEN AS NEEDED 05/28/21   Leamon Arnt, MD  triamterene-hydrochlorothiazide [facility]) 37.5-25 MG tablet TAKE 1 TABLET BY MOUTH EVERY DAY 05/21/22   Leamon Arnt, MD      Allergies    Patient has no known allergies.    Review of Systems   Review of Systems  Constitutional:  Negative for chills and fever.  Respiratory:  Negative for shortness of breath.   Cardiovascular:  Positive for chest pain. Negative for leg swelling.  Gastrointestinal:  Positive for abdominal pain, diarrhea and vomiting.  Neurological:  Positive for weakness.    Physical Exam Updated Vital Signs BP 97/61   Pulse 89   Temp 98.2 F (36.8 C) (Oral)   Resp 18   Ht '5\' 9"'$  (1.753 m)   Wt 93.4 kg   LMP 09/03/2017   SpO2 100%   BMI 30.42 kg/m  Physical Exam Vitals and nursing note reviewed.  Constitutional:      General: She is not in acute distress.    Appearance: She is well-developed.  HENT:     Head: Normocephalic and atraumatic.  Eyes:     Conjunctiva/sclera: Conjunctivae normal.  Cardiovascular:     Rate and Rhythm: Normal rate and regular rhythm.     Heart sounds: No murmur heard. Pulmonary:     Effort: Pulmonary effort is normal. No respiratory distress.  Breath sounds: Normal breath sounds.  Chest:     Comments: Tenderness to palpation under left chest wall. Abdominal:     Palpations: Abdomen is soft.     Tenderness: There is abdominal tenderness in the suprapubic area.  Musculoskeletal:        General: No swelling.     Cervical back: Neck supple.  Skin:    General: Skin is warm and dry.     Capillary Refill: Capillary refill takes less than 2 seconds.  Neurological:     Mental Status: She is alert.  Psychiatric:        Mood and Affect: Mood normal.     ED Results / Procedures / Treatments   Labs (all labs ordered are listed, but only abnormal results are displayed) Labs Reviewed  COMPREHENSIVE METABOLIC PANEL - Abnormal; Notable for the following  components:      Result Value   Potassium 2.7 (*)    Glucose, Bld 105 (*)    Creatinine, Ser 1.20 (*)    GFR, Estimated 53 (*)    All other components within normal limits  URINALYSIS, ROUTINE W REFLEX MICROSCOPIC - Abnormal; Notable for the following components:   APPearance HAZY (*)    Leukocytes,Ua TRACE (*)    All other components within normal limits  CBC WITH DIFFERENTIAL/PLATELET  MAGNESIUM  TROPONIN I (HIGH SENSITIVITY)  TROPONIN I (HIGH SENSITIVITY)    EKG EKG Interpretation  Date/Time:  Friday December 13 2022 16:49:20 EST Ventricular Rate:  78 PR Interval:  188 QRS Duration: 84 QT Interval:  358 QTC Calculation: 408 R Axis:   40 Text Interpretation: Normal sinus rhythm Normal ECG When compared with ECG of 26-Mar-2014 02:25, PR interval has decreased Confirmed by Delora Fuel ([id number]) on 12/14/2022 3:03:31 AM  Radiology DG Abdomen 1 View  Result Date: 12/14/2022 CLINICAL DATA:  Constipation for 1 week EXAM: ABDOMEN - 1 VIEW COMPARISON:  None Available. FINDINGS: Nonobstructive bowel-gas pattern. Moderate stool burden. Cholecystectomy clips. No radio-opaque calculi or other significant radiographic abnormality are seen. IMPRESSION: Nonobstructive bowel-gas pattern. Moderate stool burden. Electronically Signed   By: Yetta Glassman M.D.   On: 12/14/2022 16:56   DG Chest 2 View  Result Date: 12/13/2022 CLINICAL DATA:  Chest pain beginning yesterday. EXAM: CHEST - 2 VIEW COMPARISON:  03/26/2014 FINDINGS: The heart size and mediastinal contours are within normal limits. Both lungs are clear. The visualized skeletal structures are unremarkable. IMPRESSION: No active cardiopulmonary disease. Electronically Signed   By: Marlaine Hind M.D.   On: 12/13/2022 17:54    Procedures Procedures    Medications Ordered in ED Medications  potassium chloride 10 mEq in 100 mL IVPB (10 mEq Intravenous New Bag/Given 12/14/22 2029)  sodium chloride 0.9 % bolus 1,000 mL (1,000 mLs  Intravenous New Bag/Given 12/14/22 1656)  potassium chloride SA (KLOR-CON M) CR tablet 60 mEq (60 mEq Oral Given 12/14/22 2028)    ED Course/ Medical Decision Making/ A&P                           Medical Decision Making Patient is a 56 year old female, here for nausea, vomiting, diarrhea, and constipation.  She states she feels very weak, and she he has been hypokalemic.  Is on triamterene, hydrochlorothiazide, has been taking 20 mEq potassium twice a day for the last 10 days.  She states her potassium is still down.  We will do lab work including magnesium, for further evaluation, she just did  have a CT scan done on 12/9, and only has mild tenderness to palpation of the suprapubic region.  We will obtain a KUB to evaluate for obstruction given her constipation.  I am suspicious that her symptoms are secondary to IBS, and she already has a GI referral placed, and she has an appointment on 12/30/22.  Additionally she complains of chest pain that is below the left breast, she does have tenderness to palpation of the area.  She is not tachycardic, and she has unlabored respirations she denies any shortness of breath.  We will obtain troponins, EKG for further evaluation.  Amount and/or Complexity of Data Reviewed Labs:     Details: Troponins within normal limits, and magnesium 2.1, K2.7 Radiology: ordered.    Details: X-rays unremarkable, KUB shows no obstruction, moderate stool burden. Discussion of management or test interpretation with external provider(s): Patient was given 40 mEq oral potassium, and 40 mEq IV potassium, she was able to eat well, and was feeling better upon discharge.  I am suspicious that the potassium was depleted secondary to vomiting, earlier this month, and that it was now never fully replenished.  I am also supposed suspicious that she may need higher dose of potassium for her blood pressure medication to help balance it.  She is currently taking around 40 mEq of KCl a day, I  will prescribe for 40 mEq twice daily for the next 5 days, and then encouraged her to continue 40 mEq daily after completing the 68mq daily course. I stressed she should only take 844m total Kcl for 5 days. She will need to follow-up with her primary care doctor in 1 week for a recheck of her potassium.HEART 3. F/u with PCP.  Patient feeling much better upon discharge.  Risk Prescription drug management.   Final Clinical Impression(s) / ED Diagnoses Final diagnoses:  Hypokalemia  Chest discomfort    Rx / DC Orders ED Discharge Orders          Ordered    potassium chloride 20 MEQ TBCR  2 times daily,   Status:  Discontinued        12/14/22 2052    Potassium Chloride ER 20 MEQ TBCR  2 times daily        12/14/22 2102              SmOsvaldo ShipperPAUtah2/23/23 2103    RaEzequiel EssexMD 12/14/22 2307

## 2022-12-14 NOTE — Discharge Instructions (Addendum)
Please take the 40 mEq of potassium twice a day for 5 days, please make sure that you are only taking the total 80 mEq of potassium for the last 5 days.  Then you can reconvene and take the 20 mEq twice a day after completing the increased dose course.  Please make sure you follow-up with your primary care doctor in 1 week to recheck your potassium.  If you have severe nausea, vomiting, diarrhea, chest discomfort, shortness of breath please return to the ER.

## 2022-12-26 ENCOUNTER — Telehealth: Payer: Self-pay | Admitting: Family Medicine

## 2022-12-26 NOTE — Telephone Encounter (Signed)
Deborah Stevens was seen at ED on 12/13/22 for low potassium. Deborah Stevens would like to see Dr Jonni Sanger as soon as possible. She thought she had an appointment 12/27/22 but it was cancelled as she came in sooner.   Can Dr Jonni Sanger see her ED notes and see how soon she needs to be seen for FU?

## 2022-12-27 ENCOUNTER — Encounter: Payer: Self-pay | Admitting: Family Medicine

## 2022-12-27 ENCOUNTER — Encounter: Payer: BC Managed Care – PPO | Admitting: Family Medicine

## 2022-12-27 ENCOUNTER — Ambulatory Visit (INDEPENDENT_AMBULATORY_CARE_PROVIDER_SITE_OTHER): Payer: BC Managed Care – PPO | Admitting: Family Medicine

## 2022-12-27 VITALS — BP 110/62 | HR 88 | Temp 98.7°F | Ht 69.0 in | Wt 215.4 lb

## 2022-12-27 DIAGNOSIS — E876 Hypokalemia: Secondary | ICD-10-CM | POA: Diagnosis not present

## 2022-12-27 DIAGNOSIS — I10 Essential (primary) hypertension: Secondary | ICD-10-CM

## 2022-12-27 MED ORDER — LISINOPRIL 20 MG PO TABS: 20.0000 mg | ORAL_TABLET | Freq: Every day | ORAL | 2 refills | Status: DC

## 2022-12-27 NOTE — Patient Instructions (Addendum)
Please schedule a lab visit Tuesday morning (fasting) to check your aldosterone levels. Please stop the blood pressure medications for the next 3 days and eat a low salt diet for the next 3 days.   We will then start lisinopril 20 mg daily to treat your blood pressure.   I will let you know what to do with your potassium once today's lab return.
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
## 2022-12-28 LAB — BASIC METABOLIC PANEL
BUN: 13 mg/dL (ref 7–25)
CO2: 32 mmol/L (ref 20–32)
Calcium: 10.2 mg/dL (ref 8.6–10.4)
Chloride: 99 mmol/L (ref 98–110)
Creat: 0.9 mg/dL (ref 0.50–1.03)
Glucose, Bld: 98 mg/dL (ref 65–99)
Potassium: 3.5 mmol/L (ref 3.5–5.3)
Sodium: 141 mmol/L (ref 135–146)

## 2022-12-30 ENCOUNTER — Ambulatory Visit (INDEPENDENT_AMBULATORY_CARE_PROVIDER_SITE_OTHER): Payer: BC Managed Care – PPO | Admitting: Nurse Practitioner

## 2022-12-30 ENCOUNTER — Encounter: Payer: Self-pay | Admitting: Nurse Practitioner

## 2022-12-30 VITALS — BP 110/78 | HR 75 | Ht 69.0 in | Wt 215.0 lb

## 2022-12-30 DIAGNOSIS — R194 Change in bowel habit: Secondary | ICD-10-CM | POA: Diagnosis not present

## 2022-12-30 DIAGNOSIS — R110 Nausea: Secondary | ICD-10-CM | POA: Diagnosis not present

## 2022-12-30 DIAGNOSIS — R143 Flatulence: Secondary | ICD-10-CM | POA: Diagnosis not present

## 2022-12-30 NOTE — Progress Notes (Signed)
Assessment    Patient profile:  Deborah Stevens is a 57 y.o. year old female , known to Dr. Carlean Purl ( 2018) with a past medical history of fatty liver disease,cholecystectomy.  See PMH / [place] for additional history. She is self referred for bowel changes and nausea  # 57 yo female with nausea, bowel changes and excessive flatulence. Initially started late November with N/V/ and severe diarrhea. Labs in ED 12/8 remarkable for low K+, elevated Cr, normal CBC. CT scan w contrast unrevealing.  Now several weeks out having having persistent  nausea, flatus, intestinal gurgling and and increased frequency of soft stools. Suspect she had gastroenteritis in November and is left with post-infectious gastroparesis / IBS.   # History of adenomatous colon polyps. Seven year surveillance colonoscopy due May 2025    Plan:    Trial of IBGuard 2 capsule BID for 30 days Avoid diary for a few weeks Follow up with me in 3-4 weeks. Can cancel appt if symptoms resolve.  Call in the interim if symptoms get worse. If has recurrent diarrhea then will collect stool studies. Marland Kitchen   HPI:    Chief Complaint:  recent nausea, vomiting, diarrhea, gas  Deborah Stevens last seen in 2018 at time of colonoscopy.   Deborah Stevens was seen in the ED 12/8 with N/V and diarrhea. She was having up to 10 loose Bms a day. Her K+ was low, she had a mild bump in creatinine. Liver chemistries, lipase  and CBC were okay. CT with contrast without acute findings. Diarrhea resolved, she then developed constipation with hard stools ( developed before she started Zofran). She went back to the ED 12/11/2022 with weakness and also constipation.  Potassium was low again, she was given potassium and IV fluids.  KUB negative for obstruction, showed moderate stool burden.   Interval history: Diarrhea has resolved.  However prior to onset of symptoms in November Deborah Stevens was having a solid bowel movement about every other day.  Now she is having 2-3  soft bowel movements every day.  She has persistent nausea without vomiting.  She has increased flatus and rumbling in her intestines.  She reports having lost several pounds during recent illness but has since regained some of it back  Previous Labs / Imaging::    Latest Ref Rng & Units 12/13/2022    4:21 PM 11/29/2022    8:59 PM 11/29/2022    7:49 PM  CBC  WBC 4.0 - 10.5 K/uL 7.1   8.4   Hemoglobin 12.0 - 15.0 g/dL 13.4  14.3  13.7   Hematocrit 36.0 - 46.0 % 40.4  42.0  41.0   Platelets 150 - 400 K/uL PLATELET CLUMPS NOTED ON SMEAR, COUNT APPEARS ADEQUATE   249     Lab Results  Component Value Date   LIPASE 34 11/29/2022      Latest Ref Rng & Units 12/27/2022   11:39 AM 12/13/2022    4:21 PM 12/11/2022   10:14 AM  CMP  Glucose 65 - 99 mg/dL 98  105  108   BUN 7 - 25 mg/dL '13  19  19   '$ Creatinine 0.50 - 1.03 mg/dL 0.90  1.20  1.28   Sodium 135 - 146 mmol/L 141  140  139   Potassium 3.5 - 5.3 mmol/L 3.5  2.7  3.0   Chloride 98 - 110 mmol/L 99  100  95   CO2 20 - 32 mmol/L 32  30  34   Calcium 8.6 - 10.4 mg/dL 10.2  9.7  10.5   Total Protein 6.5 - 8.1 g/dL  7.2    Total Bilirubin 0.3 - 1.2 mg/dL  0.6    Alkaline Phos 38 - 126 U/L  73    AST 15 - 41 U/L  28    ALT 0 - 44 U/L  27      Previous GI Evaluation   May 2018 screening colonoscopy  -One 5 mm polyp in the sigmoid colon, removed with a cold snare. Resected and retrieved. - One 2 mm polyp in the ascending colon, removed with a cold biopsy forceps. Resected and retrieved. - Diverticulosis in the sigmoid colon. - The examination was otherwise normal on direct and retroflexion views.  Imaging:  DG Abdomen 1 View CLINICAL DATA:  Constipation for 1 week  EXAM: ABDOMEN - 1 VIEW  COMPARISON:  None Available.  FINDINGS: Nonobstructive bowel-gas pattern. Moderate stool burden. Cholecystectomy clips. No radio-opaque calculi or other significant radiographic abnormality are seen.  IMPRESSION: Nonobstructive  bowel-gas pattern. Moderate stool burden.  Electronically Signed   By: Yetta Glassman M.D.   On: 12/14/2022 16:56    Past Medical History:  Diagnosis Date   Anxiety    AR (allergic rhinitis) 08/17/2010   Chondromalacia, right knee 06/01/2018   Dr. Michaelle Birks 2018   Hx of adenomatous polyp of colon 05/03/2017   Hypertension    Moderate major depression, single episode ([place]) 07/15/2016   Symptomatic cholelithiasis 04/08/2014   Past Surgical History:  Procedure Laterality Date   CHOLECYSTECTOMY N/A 04/29/2014   Procedure: LAPAROSCOPIC CHOLECYSTECTOMY WITH INTRAOPERATIVE CHOLANGIOGRAM;  Surgeon: Zenovia Jarred, MD;  Location: [place];  Service: General;  Laterality: N/A;   COLONOSCOPY W/ POLYPECTOMY  04/2017   LAPAROSCOPIC LYSIS OF ADHESIONS N/A 04/29/2014   Procedure: LAPAROSCOPIC LYSIS OF ADHESIONS;  Surgeon: Zenovia Jarred, MD;  Location: [place];  Service: General;  Laterality: N/A;   Family History  Problem Relation Age of Onset   Colon cancer Neg Hx    Social History   Tobacco Use   Smoking status: Former    Types: Cigarettes    Quit date: 04/08/2013    Years since quitting: 9.7   Smokeless tobacco: Never  Vaping Use   Vaping Use: Never used  Substance Use Topics   Alcohol use: Yes    Comment: occasional   Drug use: No   Current Outpatient Medications  Medication Sig Dispense Refill   cetirizine (ZYRTEC) 10 MG tablet TAKE 1 TABLET BY MOUTH EVERY DAY 90 tablet 3   lisinopril (ZESTRIL) 20 MG tablet Take 1 tablet (20 mg total) by mouth daily. 30 tablet 2   ondansetron (ZOFRAN) 4 MG tablet Take 1 tablet (4 mg total) by mouth every 8 (eight) hours as needed for nausea or vomiting. 20 tablet 0   potassium chloride SA (KLOR-CON M) 20 MEQ tablet Take 1 tablet (20 mEq total) by mouth 2 (two) times daily. 15 tablet 0   triamcinolone cream (KENALOG) 0.1 % APPLY TO AFFECTED AREA(S) TWICE DAILY FOR 2 WEEKS THEN AS NEEDED 60 g 0   No current facility-administered medications for this  visit.   No Known Allergies   Review of Systems: Positive fatigue.  All other systems reviewed and negative except where noted in HPI.   Wt Readings from Last 3 Encounters:  12/27/22 215 lb 6.4 oz (97.7 kg)  12/13/22 206 lb (93.4 kg)  12/04/22 212 lb 6.4 oz (96.3 kg)  Physical Exam   LMP 09/03/2017  BP 110/78, HR 75 Constitutional:  Generally well appearing female in no acute distress. Psychiatric: Pleasant. Normal mood and affect. Behavior is normal. EENT: Pupils normal.  Conjunctivae are normal. No scleral icterus. Neck supple.  Cardiovascular: Normal rate, regular rhythm.  Pulmonary/chest: Effort normal and breath sounds normal. No wheezing, rales or rhonchi. Abdominal: Soft, nondistended, nontender. Bowel sounds active throughout. There are no masses palpable. No hepatomegaly. Neurological: Alert and oriented to person place and time. Extremities: No edema Skin: Skin is warm and dry. No rashes noted.  Tye Savoy, NP  12/30/2022, 7:53 AM  Cc:  Referring Provider Leamon Arnt, MD

## 2022-12-30 NOTE — Patient Instructions (Addendum)
You have been scheduled for an appointment with Deborah Savoy, NP.  on 01/30/23 at 9:00 AM . Please arrive 10 minutes early for your appointment.   Avoid dairy for 2 weeks.  Take IB Guard two capsules two times daily for 4 weeks.  Please call us for worsening symptoms.  _______________________________________________________  If you are age 57 or younger, your body mass index should be between 19-25. Your Body mass index is 31.75 kg/m. If this is out of the aformentioned range listed, please consider follow up with your Primary Care Provider.   ________________________________________________________  The [facility] providers would like to encourage you to use [facility] to communicate with providers for non-urgent requests or questions.  Due to long hold times on the telephone, sending your provider a message by [facility] may be a faster and more efficient way to get a response.  Please allow 48 business hours for a response.  Please remember that this is for non-urgent requests.  _______________________________________________________  Due to recent changes in healthcare laws, you may see the results of your imaging and laboratory studies on MyChart before your provider has had a chance to review them.  We understand that in some cases there may be results that are confusing or concerning to you. Not all laboratory results come back in the same time frame and the provider may be waiting for multiple results in order to interpret others.  Please give [place] 48 hours in order for your provider to thoroughly review all the results before contacting the office for clarification of your results.     Thank you for choosing me and [facility].  Deborah Savoy, NP.

## 2022-12-31 ENCOUNTER — Other Ambulatory Visit (INDEPENDENT_AMBULATORY_CARE_PROVIDER_SITE_OTHER): Payer: BC Managed Care – PPO

## 2022-12-31 DIAGNOSIS — E876 Hypokalemia: Secondary | ICD-10-CM

## 2023-01-07 LAB — ALDOSTERONE + RENIN ACTIVITY W/ RATIO
ALDO / PRA Ratio: 46.5 Ratio — ABNORMAL HIGH (ref 0.9–28.9)
Aldosterone: 20 ng/dL
Renin Activity: 0.43 ng/mL/h (ref 0.25–5.82)

## 2023-01-13 DIAGNOSIS — M65831 Other synovitis and tenosynovitis, right forearm: Secondary | ICD-10-CM | POA: Diagnosis not present

## 2023-01-28 DIAGNOSIS — Z1231 Encounter for screening mammogram for malignant neoplasm of breast: Secondary | ICD-10-CM | POA: Diagnosis not present

## 2023-01-28 LAB — HM MAMMOGRAPHY

## 2023-01-30 ENCOUNTER — Ambulatory Visit: Payer: BC Managed Care – PPO | Admitting: Nurse Practitioner

## 2023-01-31 ENCOUNTER — Telehealth: Payer: BC Managed Care – PPO | Admitting: Family

## 2023-01-31 ENCOUNTER — Encounter: Payer: Self-pay | Admitting: Family

## 2023-01-31 VITALS — Wt 210.0 lb

## 2023-01-31 DIAGNOSIS — J209 Acute bronchitis, unspecified: Secondary | ICD-10-CM | POA: Diagnosis not present

## 2023-01-31 MED ORDER — CHERATUSSIN AC 100-10 MG/5ML PO SOLN: 5.0000 mL | Freq: Three times a day (TID) | ORAL | 0 refills | Status: DC | PRN

## 2023-01-31 MED ORDER — AZITHROMYCIN 250 MG PO TABS: ORAL_TABLET | ORAL | 0 refills | Status: AC

## 2023-01-31 MED ORDER — PREDNISONE 20 MG PO TABS: ORAL_TABLET | ORAL | 0 refills | Status: DC

## 2023-01-31 NOTE — Progress Notes (Signed)
MyChart Video Visit    Virtual Visit via Video Note   This format is felt to be most appropriate for this patient at this time. Physical exam was limited by quality of the video and audio technology used for the visit. CMA was able to get the patient set up on a video visit.  Patient location: Home. Patient and provider in visit Provider location: Office  I discussed the limitations of evaluation and management by telemedicine and the availability of in person appointments. The patient expressed understanding and agreed to proceed.  Visit Date: 01/31/2023  Today's healthcare provider: Jeanie Sewer, NP     Subjective:   Patient ID: Deborah Stevens, female    DOB: 1966-02-06, 57 y.o.   MRN: [id number]  Chief Complaint  Patient presents with   Cough    Pt c/o Cough with clear to green mucus, Present for about 3 weeks. Has tried promethazine DM which is expired and did not help. Covid negative at home Wednesday.    HPI Persistent cough:  reports her sx started as cold sx, but cough keeps persisting for 2-3 weeks now. Sometimes productive, clear to light green mucus. Denies feeling bad or any fever, but chest is starting to hurt from coughing so much.   Assessment & Plan:   Problem List Items Addressed This Visit    Visit Diagnoses     Acute bronchitis, unspecified organism    -  Primary sending Zpack, prednisone, cough syrup, advised on use & SE of all, increased water intake, humidifier overnight. Pt started on Lisinopril in Jan, advised to discuss w/PCP if cough is not resolving.    Relevant Medications   azithromycin (ZITHROMAX) 250 MG tablet   predniSONE (DELTASONE) 20 MG tablet (Start on 02/01/2023)   guaiFENesin-codeine (CHERATUSSIN AC) 100-10 MG/5ML syrup    Past Medical History:  Diagnosis Date   Anxiety    AR (allergic rhinitis) 08/17/2010   Chondromalacia, right knee 06/01/2018   Dr. Michaelle Birks 2018   Hx of adenomatous polyp of colon 05/03/2017    Hypertension    Moderate major depression, single episode ([place]) 07/15/2016   Symptomatic cholelithiasis 04/08/2014    Past Surgical History:  Procedure Laterality Date   CHOLECYSTECTOMY N/A 04/29/2014   Procedure: LAPAROSCOPIC CHOLECYSTECTOMY WITH INTRAOPERATIVE CHOLANGIOGRAM;  Surgeon: Zenovia Jarred, MD;  Location: [place] [facility];  Service: General;  Laterality: N/A;   COLONOSCOPY W/ POLYPECTOMY  04/2017   LAPAROSCOPIC LYSIS OF ADHESIONS N/A 04/29/2014   Procedure: LAPAROSCOPIC LYSIS OF ADHESIONS;  Surgeon: Zenovia Jarred, MD;  Location: [facility];  Service: General;  Laterality: N/A;    Outpatient Medications Prior to Visit  Medication Sig Dispense Refill   cetirizine (ZYRTEC) 10 MG tablet TAKE 1 TABLET BY MOUTH EVERY DAY 90 tablet 3   cholecalciferol (VITAMIN D3) 25 MCG (1000 UNIT) tablet Take 1,000 Units by mouth daily.     lisinopril (ZESTRIL) 20 MG tablet Take 1 tablet (20 mg total) by mouth daily. 30 tablet 2   potassium chloride SA (KLOR-CON M) 20 MEQ tablet Take 1 tablet (20 mEq total) by mouth 2 (two) times daily. 15 tablet 0   triamcinolone cream (KENALOG) 0.1 % APPLY TO AFFECTED AREA(S) TWICE DAILY FOR 2 WEEKS THEN AS NEEDED 60 g 0   ondansetron (ZOFRAN) 4 MG tablet Take 1 tablet (4 mg total) by mouth every 8 (eight) hours as needed for nausea or vomiting. (Patient not taking: Reported on 01/31/2023) 20 tablet 0   No facility-administered medications  prior to visit.    No Known Allergies     Objective:   Physical Exam Vitals and nursing note reviewed.  Constitutional:      General: Pt is not in acute distress.    Appearance: Normal appearance.  HENT:     Head: Normocephalic.  Pulmonary:     Effort: No respiratory distress.  Musculoskeletal:     Cervical back: Normal range of motion.  Skin:    General: Skin is dry.     Coloration: Skin is not pale.  Neurological:     Mental Status: Pt is alert and oriented to person, place, and time.  Psychiatric:        Mood and Affect:  Mood normal.   Wt 210 lb (95.3 kg)   LMP 09/03/2017   BMI 31.01 kg/m   Wt Readings from Last 3 Encounters:  01/31/23 210 lb (95.3 kg)  12/30/22 215 lb (97.5 kg)  12/27/22 215 lb 6.4 oz (97.7 kg)    I discussed the assessment and treatment plan with the patient. The patient was provided an opportunity to ask questions and all were answered. The patient agreed with the plan and demonstrated an understanding of the instructions.   The patient was advised to call back or seek an in-person evaluation if the symptoms worsen or if the condition fails to improve as anticipated.  Jeanie Sewer, NP [place] [phone] (phone) [phone] (fax)  [place]

## 2023-01-31 NOTE — Addendum Note (Signed)
Addended byJeanie Sewer on: 01/31/2023 05:44 PM   Modules accepted: Orders

## 2023-02-09 ENCOUNTER — Other Ambulatory Visit: Payer: Self-pay | Admitting: Family Medicine

## 2023-02-09 DIAGNOSIS — J209 Acute bronchitis, unspecified: Secondary | ICD-10-CM

## 2023-02-10 NOTE — Telephone Encounter (Signed)
Patient was seen on 2/9 by stephanie hudnell np.- [facility] states [facility] - patient would like medication to be sent to [facility] on [address] near office. Patient states she does not need a F/u at the moment.

## 2023-02-11 ENCOUNTER — Other Ambulatory Visit: Payer: Self-pay

## 2023-02-11 MED ORDER — LISINOPRIL 20 MG PO TABS: 20.0000 mg | ORAL_TABLET | Freq: Every day | ORAL | 2 refills | Status: DC

## 2023-02-18 ENCOUNTER — Other Ambulatory Visit: Payer: Self-pay | Admitting: Family

## 2023-02-18 DIAGNOSIS — J209 Acute bronchitis, unspecified: Secondary | ICD-10-CM

## 2023-02-19 ENCOUNTER — Telehealth: Payer: Self-pay | Admitting: Family Medicine

## 2023-02-19 DIAGNOSIS — J301 Allergic rhinitis due to pollen: Secondary | ICD-10-CM

## 2023-02-19 NOTE — Telephone Encounter (Signed)
Msg was sent to Admin pool on 02/18/23:  Good evening.  My symptoms have had no change.  I'm coughing, sneezing, eyes tearing until they meet on my chin, nose continuously running and no fever, flu or Covid.  I've taken numerous Covid tests and it really feels like a never ending allergy attack.  It's all day and through the night. It's embarrassing now.   I believe it's an allergic reaction because the only change this last few months was a dog that sheds heavily home.   I made an appointment to an Allergist on [address] and need a doctors referral for the $700 cost to be covered.  The appointment is at [facility], [address] here in [place].  The number is [phone].  The appointment is for 3/6 at 5pm with Dr. Jill Side.   Please know it is medically necessary to have this test done as I have had allergies since 57 years old and am suffering immensely.  Thank you.

## 2023-02-26 DIAGNOSIS — R052 Subacute cough: Secondary | ICD-10-CM | POA: Diagnosis not present

## 2023-02-26 DIAGNOSIS — J3089 Other allergic rhinitis: Secondary | ICD-10-CM | POA: Diagnosis not present

## 2023-02-26 DIAGNOSIS — H1045 Other chronic allergic conjunctivitis: Secondary | ICD-10-CM | POA: Diagnosis not present

## 2023-02-26 DIAGNOSIS — R21 Rash and other nonspecific skin eruption: Secondary | ICD-10-CM | POA: Diagnosis not present

## 2023-03-10 DIAGNOSIS — J301 Allergic rhinitis due to pollen: Secondary | ICD-10-CM | POA: Diagnosis not present

## 2023-03-11 DIAGNOSIS — J3081 Allergic rhinitis due to animal (cat) (dog) hair and dander: Secondary | ICD-10-CM | POA: Diagnosis not present

## 2023-03-11 DIAGNOSIS — J3089 Other allergic rhinitis: Secondary | ICD-10-CM | POA: Diagnosis not present

## 2023-03-14 ENCOUNTER — Ambulatory Visit: Payer: BC Managed Care – PPO | Admitting: Family Medicine

## 2023-03-14 VITALS — BP 120/78 | HR 87 | Temp 98.0°F | Ht 69.0 in | Wt 219.8 lb

## 2023-03-14 DIAGNOSIS — E876 Hypokalemia: Secondary | ICD-10-CM

## 2023-03-14 DIAGNOSIS — I10 Essential (primary) hypertension: Secondary | ICD-10-CM

## 2023-03-14 DIAGNOSIS — J301 Allergic rhinitis due to pollen: Secondary | ICD-10-CM

## 2023-03-14 LAB — BASIC METABOLIC PANEL
BUN: 13 mg/dL (ref 6–23)
CO2: 33 mEq/L — ABNORMAL HIGH (ref 19–32)
Calcium: 10 mg/dL (ref 8.4–10.5)
Chloride: 103 mEq/L (ref 96–112)
Creatinine, Ser: 0.83 mg/dL (ref 0.40–1.20)
GFR: 78.73 mL/min (ref >60.00)
Glucose, Bld: 97 mg/dL (ref 70–99)
Potassium: 4.8 mEq/L (ref 3.5–5.1)
Sodium: 142 mEq/L (ref 135–145)

## 2023-03-14 NOTE — Patient Instructions (Addendum)
Please schedule a lab visit to recheck your blood work between 8 and 10 am at your convenience.   Please check to see if you are still on potassium.  Please check your bottle to see when it was prescribed and by whom and send me the information. I haven't prescribed it since December.   This is the last note I sent to you in January: Dear Deborah Stevens, Your potassium has recovered! It is now low normal. Please stop the maxzide blood pressure pill and decrease the potassium supplement to 48meq (1 tab) daily for 7 more days, then stop it completely. I'd like to see you in the office again in 4 weeks to recheck your blood pressure on the new medication and recheck your potassium levels off of the supplements and diuretic. Please schedule that OV with me. Sincerely, Dr. Jonni Sanger  Written by Leamon Arnt, MD on 12/28/2022  8:45 AM EST Seen by patient Deborah Stevens on 12/30/2022 10:14 AM  If you have any questions or concerns, please don't hesitate to send me a message via MyChart or call the office at [phone]. Thank you for visiting with [place] today! It's our pleasure caring for you.

## 2023-03-15 ENCOUNTER — Emergency Department (HOSPITAL_COMMUNITY): Payer: BC Managed Care – PPO

## 2023-03-15 ENCOUNTER — Inpatient Hospital Stay (HOSPITAL_COMMUNITY)
Admission: EM | Admit: 2023-03-15 | Discharge: 2023-03-18 | DRG: 153 | Disposition: A | Discharge status: Home / Self Care | Payer: BC Managed Care – PPO | Attending: Family Medicine | Admitting: Family Medicine

## 2023-03-15 ENCOUNTER — Other Ambulatory Visit: Payer: Self-pay

## 2023-03-15 ENCOUNTER — Inpatient Hospital Stay (HOSPITAL_COMMUNITY): Payer: BC Managed Care – PPO

## 2023-03-15 DIAGNOSIS — R519 Headache, unspecified: Secondary | ICD-10-CM | POA: Diagnosis not present

## 2023-03-15 DIAGNOSIS — Z8719 Personal history of other diseases of the digestive system: Secondary | ICD-10-CM

## 2023-03-15 DIAGNOSIS — J0510 Acute epiglottitis without obstruction: Principal | ICD-10-CM | POA: Diagnosis present

## 2023-03-15 DIAGNOSIS — F419 Anxiety disorder, unspecified: Secondary | ICD-10-CM | POA: Diagnosis not present

## 2023-03-15 DIAGNOSIS — J309 Allergic rhinitis, unspecified: Secondary | ICD-10-CM | POA: Diagnosis present

## 2023-03-15 DIAGNOSIS — R0603 Acute respiratory distress: Secondary | ICD-10-CM | POA: Diagnosis not present

## 2023-03-15 DIAGNOSIS — Z1152 Encounter for screening for COVID-19: Secondary | ICD-10-CM | POA: Diagnosis not present

## 2023-03-15 DIAGNOSIS — Z86010 Personal history of colonic polyps: Secondary | ICD-10-CM | POA: Diagnosis not present

## 2023-03-15 DIAGNOSIS — T783XXA Angioneurotic edema, initial encounter: Secondary | ICD-10-CM | POA: Diagnosis present

## 2023-03-15 DIAGNOSIS — R1310 Dysphagia, unspecified: Secondary | ICD-10-CM | POA: Diagnosis not present

## 2023-03-15 DIAGNOSIS — I10 Essential (primary) hypertension: Secondary | ICD-10-CM | POA: Diagnosis present

## 2023-03-15 DIAGNOSIS — R058 Other specified cough: Secondary | ICD-10-CM | POA: Diagnosis not present

## 2023-03-15 DIAGNOSIS — Z79899 Other long term (current) drug therapy: Secondary | ICD-10-CM

## 2023-03-15 DIAGNOSIS — J301 Allergic rhinitis due to pollen: Secondary | ICD-10-CM | POA: Diagnosis not present

## 2023-03-15 DIAGNOSIS — R059 Cough, unspecified: Secondary | ICD-10-CM | POA: Diagnosis present

## 2023-03-15 DIAGNOSIS — T464X5A Adverse effect of angiotensin-converting-enzyme inhibitors, initial encounter: Secondary | ICD-10-CM

## 2023-03-15 DIAGNOSIS — T783XXD Angioneurotic edema, subsequent encounter: Secondary | ICD-10-CM | POA: Diagnosis not present

## 2023-03-15 DIAGNOSIS — J0511 Acute epiglottitis with obstruction: Secondary | ICD-10-CM | POA: Diagnosis not present

## 2023-03-15 DIAGNOSIS — Z87891 Personal history of nicotine dependence: Secondary | ICD-10-CM

## 2023-03-15 DIAGNOSIS — J392 Other diseases of pharynx: Secondary | ICD-10-CM | POA: Diagnosis not present

## 2023-03-15 HISTORY — DX: Acute epiglottitis without obstruction: J05.10

## 2023-03-15 LAB — BASIC METABOLIC PANEL
Anion gap: 8 (ref 5–15)
BUN: 15 mg/dL (ref 6–20)
CO2: 26 mmol/L (ref 22–32)
Calcium: 9.3 mg/dL (ref 8.9–10.3)
Chloride: 104 mmol/L (ref 98–111)
Creatinine, Ser: 0.79 mg/dL (ref 0.44–1.00)
GFR, Estimated: >60 mL/min (ref >60)
Glucose, Bld: 143 mg/dL — ABNORMAL HIGH (ref 70–99)
Potassium: 3.7 mmol/L (ref 3.5–5.1)
Sodium: 138 mmol/L (ref 135–145)

## 2023-03-15 LAB — MRSA NEXT GEN BY PCR, NASAL: MRSA by PCR Next Gen: NOT DETECTED

## 2023-03-15 LAB — CBC WITH DIFFERENTIAL/PLATELET
Abs Immature Granulocytes: 0.02 10*3/uL (ref 0.00–0.07)
Basophils Absolute: 0 10*3/uL (ref 0.0–0.1)
Basophils Relative: 0 %
Eosinophils Absolute: 0.1 10*3/uL (ref 0.0–0.5)
Eosinophils Relative: 2 %
HCT: 37.5 % (ref 36.0–46.0)
Hemoglobin: 12.2 g/dL (ref 12.0–15.0)
Immature Granulocytes: 0 %
Lymphocytes Relative: 42 %
Lymphs Abs: 3.1 10*3/uL (ref 0.7–4.0)
MCH: 30 pg (ref 26.0–34.0)
MCHC: 32.5 g/dL (ref 30.0–36.0)
MCV: 92.1 fL (ref 80.0–100.0)
Monocytes Absolute: 0.5 10*3/uL (ref 0.1–1.0)
Monocytes Relative: 7 %
Neutro Abs: 3.6 10*3/uL (ref 1.7–7.7)
Neutrophils Relative %: 49 %
Platelets: 225 10*3/uL (ref 150–400)
RBC: 4.07 MIL/uL (ref 3.87–5.11)
RDW: 12.9 % (ref 11.5–15.5)
WBC: 7.4 10*3/uL (ref 4.0–10.5)
nRBC: 0 % (ref 0.0–0.2)

## 2023-03-15 LAB — I-STAT CHEM 8, ED
BUN: 13 mg/dL (ref 6–20)
Calcium, Ion: 1.19 mmol/L (ref 1.15–1.40)
Chloride: 102 mmol/L (ref 98–111)
Creatinine, Ser: 0.8 mg/dL (ref 0.44–1.00)
Glucose, Bld: 137 mg/dL — ABNORMAL HIGH (ref 70–99)
HCT: 38 % (ref 36.0–46.0)
Hemoglobin: 12.9 g/dL (ref 12.0–15.0)
Potassium: 3.4 mmol/L — ABNORMAL LOW (ref 3.5–5.1)
Sodium: 140 mmol/L (ref 135–145)
TCO2: 27 mmol/L (ref 22–32)

## 2023-03-15 LAB — HIV ANTIBODY (ROUTINE TESTING W REFLEX): HIV Screen 4th Generation wRfx: NONREACTIVE

## 2023-03-15 LAB — GLUCOSE, CAPILLARY
Glucose-Capillary: 133 mg/dL — ABNORMAL HIGH (ref 70–99)
Glucose-Capillary: 138 mg/dL — ABNORMAL HIGH (ref 70–99)
Glucose-Capillary: 134 mg/dL — ABNORMAL HIGH (ref 70–99)
Glucose-Capillary: 143 mg/dL — ABNORMAL HIGH (ref 70–99)
Glucose-Capillary: 139 mg/dL — ABNORMAL HIGH (ref 70–99)
Glucose-Capillary: 164 mg/dL — ABNORMAL HIGH (ref 70–99)

## 2023-03-15 LAB — RESP PANEL BY RT-PCR (RSV, FLU A&B, COVID)  RVPGX2
Influenza A by PCR: NEGATIVE
Influenza B by PCR: NEGATIVE
Resp Syncytial Virus by PCR: NEGATIVE
SARS Coronavirus 2 by RT PCR: NEGATIVE

## 2023-03-15 LAB — GROUP A STREP BY PCR: Group A Strep by PCR: NOT DETECTED

## 2023-03-15 MED ORDER — BENZONATATE 100 MG PO CAPS
200.0000 mg | ORAL_CAPSULE | Freq: Three times a day (TID) | ORAL | Status: DC | PRN
  Administered 2023-03-15 – 2023-03-17 (×4): 200 mg via ORAL
  Filled 2023-03-15 (×5): qty 2

## 2023-03-15 MED ORDER — DEXTROSE-NACL 5-0.45 % IV SOLN: INTRAVENOUS | Status: DC

## 2023-03-15 MED ORDER — LORATADINE 10 MG PO TABS: 10.0000 mg | ORAL_TABLET | Freq: Every day | ORAL | Status: DC

## 2023-03-15 MED ORDER — ONDANSETRON HCL 4 MG/2ML IJ SOLN
4.0000 mg | Freq: Four times a day (QID) | INTRAMUSCULAR | Status: DC | PRN
  Administered 2023-03-15: 4 mg via INTRAVENOUS
  Filled 2023-03-15: qty 2

## 2023-03-15 MED ORDER — METHYLPREDNISOLONE SODIUM SUCC 125 MG IJ SOLR
125.0000 mg | Freq: Once | INTRAMUSCULAR | Status: AC
  Administered 2023-03-15: 125 mg via INTRAVENOUS
  Filled 2023-03-15: qty 2

## 2023-03-15 MED ORDER — RACEPINEPHRINE HCL 2.25 % IN NEBU
0.5000 mL | INHALATION_SOLUTION | Freq: Once | RESPIRATORY_TRACT | Status: AC
  Administered 2023-03-15: 0.5 mL via RESPIRATORY_TRACT
  Filled 2023-03-15: qty 0.5

## 2023-03-15 MED ORDER — FAMOTIDINE IN NACL 20-0.9 MG/50ML-% IV SOLN
20.0000 mg | Freq: Two times a day (BID) | INTRAVENOUS | Status: DC
  Administered 2023-03-15 – 2023-03-16 (×3): 20 mg via INTRAVENOUS
  Filled 2023-03-15 (×3): qty 50

## 2023-03-15 MED ORDER — RACEPINEPHRINE HCL 2.25 % IN NEBU: 0.5000 mL | INHALATION_SOLUTION | RESPIRATORY_TRACT | Status: DC | PRN

## 2023-03-15 MED ORDER — FAMOTIDINE IN NACL 20-0.9 MG/50ML-% IV SOLN
20.0000 mg | Freq: Once | INTRAVENOUS | Status: AC
  Administered 2023-03-15: 20 mg via INTRAVENOUS
  Filled 2023-03-15: qty 50

## 2023-03-15 MED ORDER — CHLORHEXIDINE GLUCONATE CLOTH 2 % EX PADS
6.0000 | MEDICATED_PAD | Freq: Every day | CUTANEOUS | Status: DC
  Administered 2023-03-15: 6 via TOPICAL

## 2023-03-15 MED ORDER — POLYETHYLENE GLYCOL 3350 17 G PO PACK: 17.0000 g | PACK | Freq: Every day | ORAL | Status: DC | PRN

## 2023-03-15 MED ORDER — LORATADINE 10 MG PO TABS: 10.0000 mg | ORAL_TABLET | Freq: Every day | ORAL | Status: DC | PRN

## 2023-03-15 MED ORDER — SODIUM CHLORIDE 0.9 % IV SOLN
1.0000 g | INTRAVENOUS | Status: DC
  Administered 2023-03-15 – 2023-03-18 (×4): 1 g via INTRAVENOUS
  Filled 2023-03-15 (×4): qty 10

## 2023-03-15 MED ORDER — SODIUM CHLORIDE 0.9 % IV SOLN: INTRAVENOUS | Status: DC

## 2023-03-15 MED ORDER — ACETAMINOPHEN 325 MG PO TABS
650.0000 mg | ORAL_TABLET | Freq: Four times a day (QID) | ORAL | Status: DC | PRN
  Administered 2023-03-15 – 2023-03-17 (×2): 650 mg via ORAL
  Filled 2023-03-15 (×2): qty 2

## 2023-03-15 MED ORDER — NAPROXEN 250 MG PO TABS
250.0000 mg | ORAL_TABLET | Freq: Two times a day (BID) | ORAL | Status: DC | PRN
  Administered 2023-03-15: 250 mg via ORAL
  Filled 2023-03-15 (×2): qty 1

## 2023-03-15 MED ORDER — IOHEXOL 300 MG/ML  SOLN
75.0000 mL | Freq: Once | INTRAMUSCULAR | Status: AC | PRN
  Administered 2023-03-15: 75 mL via INTRAVENOUS

## 2023-03-15 MED ORDER — DOCUSATE SODIUM 100 MG PO CAPS: 100.0000 mg | ORAL_CAPSULE | Freq: Two times a day (BID) | ORAL | Status: DC | PRN

## 2023-03-15 MED ORDER — DEXAMETHASONE SODIUM PHOSPHATE 4 MG/ML IJ SOLN
4.0000 mg | Freq: Four times a day (QID) | INTRAMUSCULAR | Status: AC
  Administered 2023-03-15 – 2023-03-17 (×7): 4 mg via INTRAVENOUS
  Filled 2023-03-15 (×7): qty 1

## 2023-03-15 MED ORDER — ENOXAPARIN SODIUM 40 MG/0.4ML IJ SOSY
40.0000 mg | PREFILLED_SYRINGE | INTRAMUSCULAR | Status: DC
  Filled 2023-03-15 (×4): qty 0.4

## 2023-03-15 MED ORDER — DIPHENHYDRAMINE HCL 50 MG/ML IJ SOLN
25.0000 mg | Freq: Four times a day (QID) | INTRAMUSCULAR | Status: DC | PRN
  Filled 2023-03-15: qty 1

## 2023-03-15 MED ORDER — BUTALBITAL-APAP-CAFFEINE 50-325-40 MG PO TABS
1.0000 | ORAL_TABLET | Freq: Four times a day (QID) | ORAL | Status: DC | PRN
  Administered 2023-03-15 – 2023-03-17 (×2): 1 via ORAL
  Filled 2023-03-15 (×2): qty 1

## 2023-03-15 NOTE — Progress Notes (Signed)
[facility] Progress Note Patient Name: Deborah Stevens DOB: 1966-04-16 MRN: [id number]   Date of Service  03/15/2023  HPI/Events of Note  Notified of headache even after naproxen and tylenol.   Pt is awake and alert, not in distress.    eICU Interventions  Trial of Fioricet.        Intervention Category Intermediate Interventions: Other:  [name] 03/15/2023, 9:59 PM

## 2023-03-15 NOTE — Progress Notes (Signed)
NAME:  Deborah Stevens, MRN:  [id number], DOB:  03-15-1966, LOS: 0 ADMISSION DATE:  03/15/2023, CONSULTATION DATE:  03/15/2023 REFERRING MD:  Dr. Leonette Monarch, ER CHIEF COMPLAINT:  throat swelling   History of Present Illness:  57 yo female former smoker with hx of HTN started on lisinopril in January 2024 and the developed persistent cough.  In evening of 03/14/23 she developed difficulty swallowing with hoarseness.  She then felt her tongue was swelling and throat closing, and caused acute respiratory distress.  CT imaging in ER showed acute epiglottis/supraglottitis and narrowed airway 4 mm.  Given epinephrine, solumedrol, antibiotics, pepcid.  PCCM asked to admit to ICU.  Pertinent  Medical History  HTN, Colon polyp, Anxiety, Allergies, Depression  Significant Hospital Events: Including procedures, antibiotic start and stop dates in addition to other pertinent events   3/23 admit  Interim History / Subjective:  Voice quality and swallowing better.  Wants something to eat.  Passed bedside swallow assessment.  Objective   Blood pressure 123/79, pulse (!) 101, temperature 98 F (36.7 C), temperature source Oral, resp. rate 16, height 5\' 8"  (1.727 m), weight 94.8 kg, last menstrual period 09/03/2017, SpO2 98 %.        Intake/Output Summary (Last 24 hours) at 03/15/2023 1100 Last data filed at 03/15/2023 1000 Gross per 24 hour  Intake 345.63 ml  Output 300 ml  Net 45.63 ml   Filed Weights   03/15/23 0052 03/15/23 0538  Weight: 97.5 kg 94.8 kg    Examination:  General - alert Eyes - pupils reactive ENT - no sinus tenderness, no stridor, no tongue/lip swelling, slightly raspy voice but speech fluent otherwise Cardiac - regular rate/rhythm, no murmur Chest - equal breath sounds b/l, no wheezing or rales Abdomen - soft, non tender, + bowel sounds Extremities - no cyanosis, clubbing, or edema Skin - no rashes Neuro - normal strength, moves extremities, follows commands Psych -  normal mood and behavior   Resolved Hospital Problem list     Assessment & Plan:   Acute epiglottis/supraglottitis. - day 1 of rocephin - decadron 4 mg q6h x 28 hrs - clinically improving >> don't think she needs ENT assessment at this time  ACE inhibitor cough. - lisinopril discontinued  Hx of HTN. - goal BP normotensive  Best Practice (right click and "Reselect all SmartList Selections" daily)   Diet/type: Regular consistency (see orders) DVT prophylaxis: LMWH GI prophylaxis: H2B Lines: N/A Foley:  N/A Code Status:  full code Last date of multidisciplinary goals of care discussion [x]   Labs       Latest Ref Rng & Units 03/15/2023    2:14 AM 03/15/2023    2:00 AM 03/14/2023   11:55 AM  CMP  Glucose 70 - 99 mg/dL 137  143  97   BUN 6 - 20 mg/dL 13  15  13    Creatinine 0.44 - 1.00 mg/dL 0.80  0.79  0.83   Sodium 135 - 145 mmol/L 140  138  142   Potassium 3.5 - 5.1 mmol/L 3.4  3.7  4.8   Chloride 98 - 111 mmol/L 102  104  103   CO2 22 - 32 mmol/L  26  33   Calcium 8.9 - 10.3 mg/dL  9.3  10.0        Latest Ref Rng & Units 03/15/2023    2:14 AM 03/15/2023    2:00 AM 12/13/2022    4:21 PM  CBC  WBC 4.0 - 10.5 K/uL  7.4  7.1   Hemoglobin 12.0 - 15.0 g/dL 12.9  12.2  13.4   Hematocrit 36.0 - 46.0 % 38.0  37.5  40.4   Platelets 150 - 400 K/uL  225  PLATELET CLUMPS NOTED ON SMEAR, COUNT APPEARS ADEQUATE     CBG (last 3)  Recent Labs    03/15/23 0543 03/15/23 0736  GLUCAP 133* 138*   Signature:  Chesley Mires, MD [place] Pager - [phone] or [phone] 03/15/2023, 11:10 AM

## 2023-03-15 NOTE — ED Notes (Signed)
Report called to Abby RN at [facility]. [facility] called at this time for transport.

## 2023-03-15 NOTE — ED Notes (Addendum)
PT place in gown also on monitor

## 2023-03-15 NOTE — ED Notes (Signed)
[facility] here to take patient to [facility], patient taken out of ED via stretcher.

## 2023-03-15 NOTE — ED Triage Notes (Signed)
Pt arrives via POV c/o bilateral throat swelling x 24 hrs causing difficulty breathing and swallowing. Has cough and emesis today. Tenderness on palpation. Denies fevers. States taking epi pen at 30 min pta d/t concerns for increasing throat. No rash/hives. NO resp distress in triage.

## 2023-03-15 NOTE — Progress Notes (Signed)
RT tried to obtain ABG from pt. Pt said it hurts too much and come out/stop. RN aware to get VBG if needed. RT did not get ABG.

## 2023-03-15 NOTE — Progress Notes (Signed)
Pt states she no longer wants her spouse to have information regarding her healthcare.  Password made: [place].  Pt requests all information should go through herself. If she becomes unable to make healthcare decisions, may contact her sons:  1st contact: Narcissus Macdermott (Son): [phone] 2nd contact: Mykea Bonne (Son): [phone]
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
## 2023-03-15 NOTE — ED Notes (Addendum)
RT attempted ABG and unsuccessful, patient then refused any more attempts. Duwayne Heck MD notified.

## 2023-03-15 NOTE — H&P (Addendum)
NAME:  Deborah Stevens, MRN:  [id number], DOB:  11/15/1966, LOS: 0 ADMISSION DATE:  03/15/2023, CONSULTATION DATE:  03/15/23 REFERRING MD:  Dgaly, CHIEF COMPLAINT:  throat swelling   History of Present Illness:  57 yo woman with hx of anxiety, depression, hypertension, sensation of B throat swelling x 24 hours, difficulty breathing and swallowing.    Took epi pen/at 30 min PTA  Noted to have no resp distress in triage.   1 day of worsening throat swelling and intermittent difficulty  breathing.  Dry cough x 2 months.   ED thought he had angioedema of the soft palate, muffled voice.   Solumedrol, pepcid, racemic epi.    Recently started lisinopril (January).  Has also been dealing with allergic rhinitis symptoms for several months.  CT neck 1. Findings consistent with acute epiglottitis/supraglottitis. Supraglottic airway is attenuated and narrowed measuring 4 mm in diameter at its most narrow point. Close clinical monitoring recommended as this patient is potentially at risk for developing airway compromise. No discrete abscess or drainable fluid collection. 2. Moderate degenerative spondylosis at C3-4 through C6-7.  Pertinent  Medical History  Anxiety  Allergic rhinitic  Hypertnesion  MDD Acute epiglottis   Significant Hospital Events: Including procedures, antibiotic start and stop dates in addition to other pertinent events     Interim History / Subjective:   Objective   Blood pressure 121/72, pulse 81, temperature 98.3 F (36.8 C), temperature source Oral, resp. rate 13, height 5\' 8"  (1.727 m), weight 94.8 kg, last menstrual period 09/03/2017, SpO2 97 %.        Intake/Output Summary (Last 24 hours) at 03/15/2023 [id number] Last data filed at 03/15/2023 0538 Gross per 24 hour  Intake 100 ml  Output 300 ml  Net -200 ml   Filed Weights   03/15/23 0052 03/15/23 0538  Weight: 97.5 kg 94.8 kg    Examination: General: nad  HENT: ncat Lungs: CTAB  Cardiovascular:  rrr no mgr  Abdomen: Nt, nd, nbs Extremities: normal  Neuro: a and O   Resolved Hospital Problem list     Assessment & Plan:  Epiglottitis: unk cause.  Received steroids.  Racemic epi Has not needed epinephrine since arrival. Maintenance fluids.  Ceftriaxone.  Monitor for airway needs.    Best Practice (right click and "Reselect all SmartList Selections" daily)   Diet/type: NPO DVT prophylaxis: not indicated GI prophylaxis: PPI Lines: N/A Foley:  N/A Code Status:  full code Last date of multidisciplinary goals of care discussion []   Labs   CBC: Recent Labs  Lab 03/15/23 0200 03/15/23 0214  WBC 7.4  --   NEUTROABS 3.6  --   HGB 12.2 12.9  HCT 37.5 38.0  MCV 92.1  --   PLT 225  --     Basic Metabolic Panel: Recent Labs  Lab 03/14/23 1155 03/15/23 0200 03/15/23 0214  NA 142 138 140  K 4.8 3.7 3.4*  CL 103 104 102  CO2 33* 26  --   GLUCOSE 97 143* 137*  BUN 13 15 13   CREATININE 0.83 0.79 0.80  CALCIUM 10.0 9.3  --    GFR: Estimated Creatinine Clearance: 94.6 mL/min (by C-G formula based on SCr of 0.8 mg/dL). Recent Labs  Lab 03/15/23 0200  WBC 7.4    Liver Function Tests: No results for input(s): "AST", "ALT", "ALKPHOS", "BILITOT", "PROT", "ALBUMIN" in the last 168 hours. No results for input(s): "LIPASE", "AMYLASE" in the last 168 hours. No results for input(s): "AMMONIA" in the  last 168 hours.  ABG    Component Value Date/Time   TCO2 27 03/15/2023 0214     Coagulation Profile: No results for input(s): "INR", "PROTIME" in the last 168 hours.  Cardiac Enzymes: No results for input(s): "CKTOTAL", "CKMB", "CKMBINDEX", "TROPONINI" in the last 168 hours.  HbA1C: Hgb A1c MFr Bld  Date/Time Value Ref Range Status  11/20/2010 05:30 AM (H) <5.7 % Final   5.8 (NOTE)                                                                       According to the ADA Clinical Practice Recommendations for 2011, when HbA1c is used as a screening test:    >=6.5%   Diagnostic of Diabetes Mellitus           (if abnormal result  is confirmed)  5.7-6.4%   Increased risk of developing Diabetes Mellitus  References:Diagnosis and Classification of Diabetes Mellitus,Diabetes [id number] 1):S62-S69 and Standards of Medical Care in         Diabetes - 2011,Diabetes [id number]  (Suppl 1):S11-S61.    CBG: Recent Labs  Lab 03/15/23 0543  GLUCAP 133*    Review of Systems:   Review of Systems  Constitutional:  Negative for chills and fever.  HENT:  Negative for hearing loss.   Eyes:  Negative for blurred vision.  Respiratory:  Negative for cough.   Cardiovascular:  Negative for chest pain.  Gastrointestinal:  Negative for heartburn.  Genitourinary:  Negative for dysuria.  Musculoskeletal:  Negative for myalgias.  Skin:  Negative for rash.  Neurological:  Negative for dizziness.  Endo/Heme/Allergies:  Does not bruise/bleed easily.  Psychiatric/Behavioral:  Negative for depression.      Past Medical History:  She,  has a past medical history of Anxiety, AR (allergic rhinitis) (08/17/2010), Chondromalacia, right knee (06/01/2018), adenomatous polyp of colon (05/03/2017), Hypertension, Moderate major depression, single episode ([place]) (07/15/2016), and Symptomatic cholelithiasis (04/08/2014).   Surgical History:   Past Surgical History:  Procedure Laterality Date   CHOLECYSTECTOMY N/A 04/29/2014   Procedure: LAPAROSCOPIC CHOLECYSTECTOMY WITH INTRAOPERATIVE CHOLANGIOGRAM;  Surgeon: Zenovia Jarred, MD;  Location: [place];  Service: General;  Laterality: N/A;   COLONOSCOPY W/ POLYPECTOMY  04/2017   LAPAROSCOPIC LYSIS OF ADHESIONS N/A 04/29/2014   Procedure: LAPAROSCOPIC LYSIS OF ADHESIONS;  Surgeon: Zenovia Jarred, MD;  Location: [place];  Service: General;  Laterality: N/A;     Social History:   reports that she quit smoking about 9 years ago. Her smoking use included cigarettes. She has never used smokeless tobacco. She reports current alcohol  use. She reports that she does not use drugs.   Family History:  Her family history is negative for Colon cancer, Esophageal cancer, Liver cancer, Pancreatic cancer, Stomach cancer, and Rectal cancer.   Allergies No Known Allergies   Home Medications  Prior to Admission medications   Medication Sig Start Date End Date Taking? Authorizing Provider  azelastine (OPTIVAR) 0.05 % ophthalmic solution Place 1 drop into both eyes 2 (two) times daily as needed (allergies). 02/26/23  Yes [provider]  cholecalciferol (VITAMIN D3) 25 MCG (1000 UNIT) tablet Take 1,000 Units by mouth daily.   Yes [provider]  diclofenac (VOLTAREN) 75 MG EC tablet  Take 75 mg by mouth 2 (two) times daily. 03/14/23  Yes [provider]  EPINEPHrine 0.3 mg/0.3 mL IJ SOAJ injection Inject 0.3 mg into the muscle as needed for anaphylaxis. 02/26/23  Yes [provider]  ipratropium (ATROVENT) 0.06 % nasal spray Place 1 spray into both nostrils daily as needed (allergies). 02/26/23  Yes [provider]  levocetirizine (XYZAL) 5 MG tablet Take 5 mg by mouth every evening. 02/26/23  Yes [provider]  lisinopril (ZESTRIL) 20 MG tablet Take 1 tablet (20 mg total) by mouth daily. 02/11/23  Yes Leamon Arnt, MD  potassium chloride SA (KLOR-CON M) 20 MEQ tablet Take 1 tablet (20 mEq total) by mouth 2 (two) times daily. 12/13/22  Yes Leamon Arnt, MD  guaiFENesin-codeine [facility]) 100-10 MG/5ML syrup Take 5 mLs by mouth 3 (three) times daily as needed for cough (Do not drive while taking this medication.). Patient not taking: Reported on 03/15/2023 01/31/23   Jeanie Sewer, NP  triamcinolone cream (KENALOG) 0.1 % APPLY TO AFFECTED AREA(S) TWICE DAILY FOR 2 WEEKS THEN AS NEEDED Patient not taking: Reported on 03/15/2023 05/28/21   Leamon Arnt, MD     Critical care time: 45 min

## 2023-03-16 ENCOUNTER — Encounter (HOSPITAL_COMMUNITY): Payer: Self-pay | Admitting: Pulmonary Disease

## 2023-03-16 DIAGNOSIS — J0511 Acute epiglottitis with obstruction: Secondary | ICD-10-CM | POA: Diagnosis not present

## 2023-03-16 DIAGNOSIS — J301 Allergic rhinitis due to pollen: Secondary | ICD-10-CM | POA: Diagnosis not present

## 2023-03-16 DIAGNOSIS — T464X5A Adverse effect of angiotensin-converting-enzyme inhibitors, initial encounter: Secondary | ICD-10-CM

## 2023-03-16 DIAGNOSIS — R058 Other specified cough: Secondary | ICD-10-CM | POA: Diagnosis not present

## 2023-03-16 LAB — BASIC METABOLIC PANEL
Anion gap: 8 (ref 5–15)
BUN: 14 mg/dL (ref 6–20)
CO2: 22 mmol/L (ref 22–32)
Calcium: 9.5 mg/dL (ref 8.9–10.3)
Chloride: 106 mmol/L (ref 98–111)
Creatinine, Ser: 0.93 mg/dL (ref 0.44–1.00)
GFR, Estimated: >60 mL/min (ref >60)
Glucose, Bld: 146 mg/dL — ABNORMAL HIGH (ref 70–99)
Potassium: 3.9 mmol/L (ref 3.5–5.1)
Sodium: 136 mmol/L (ref 135–145)

## 2023-03-16 LAB — GLUCOSE, CAPILLARY: Glucose-Capillary: 137 mg/dL — ABNORMAL HIGH (ref 70–99)

## 2023-03-16 MED ORDER — LEVOCETIRIZINE DIHYDROCHLORIDE 5 MG PO TABS
5.0000 mg | ORAL_TABLET | Freq: Every evening | ORAL | Status: DC
  Administered 2023-03-17: 5 mg via ORAL
  Filled 2023-03-16: qty 1

## 2023-03-16 MED ORDER — FLUTICASONE PROPIONATE 50 MCG/ACT NA SUSP
2.0000 | Freq: Every day | NASAL | Status: DC
  Administered 2023-03-16 – 2023-03-17 (×2): 2 via NASAL
  Filled 2023-03-16 (×2): qty 16

## 2023-03-16 MED ORDER — FAMOTIDINE 20 MG PO TABS
20.0000 mg | ORAL_TABLET | Freq: Two times a day (BID) | ORAL | Status: DC
  Administered 2023-03-16 – 2023-03-18 (×4): 20 mg via ORAL
  Filled 2023-03-16 (×4): qty 1

## 2023-03-16 MED ORDER — SALINE SPRAY 0.65 % NA SOLN: 1.0000 | NASAL | Status: DC | PRN

## 2023-03-16 NOTE — Progress Notes (Signed)
   03/16/23 0840  Spiritual Encounters  Type of Visit Initial  Care provided to: Patient  Referral source Patient request  Reason for visit Routine spiritual support  OnCall Visit Yes  Spiritual Framework  Presenting Themes Meaning/purpose/sources of inspiration;Goals in life/care;Values and beliefs;Significant life change;Impactful experiences and emotions;Courage hope and growth;Community and relationships  Values/beliefs God's divine plan, God as healer, Purpose  Strengths Active faith in God, Finds God has kept her alive for a reason  Patient Stress Factors Financial concerns  Family Stress Factors None identified  Goals  Self/Personal Goals To "share (her) testimony"  Interventions  Spiritual Care Interventions Made Established relationship of care and support;Compassionate presence;Reflective listening;Narrative/life review;Explored values/beliefs/practices/strengths;Meaning making;Encouragement  Intervention Outcomes  Outcomes Connection to spiritual care;Awareness around self/spiritual resourses;Connection to values and goals of care;Autonomy/agency;Awareness of support  Spiritual Care Plan  Spiritual Care Issues Still Outstanding No further spiritual care needs at this time (see row info)   Patient requested a chaplain visit. Patient shared her "testimony." She is thankful to be alive. Patient believes God has a purpose for her. No further needs at this time. Patient is aware of how to contact spiritual care.   Note Prepared by Abbott Pao, Chaplain Resident [phone]
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
## 2023-03-16 NOTE — Progress Notes (Signed)
NAME:  Deborah Stevens, MRN:  [id number], DOB:  09-29-1966, LOS: 1 ADMISSION DATE:  03/15/2023, CONSULTATION DATE:  03/15/2023 REFERRING MD:  Dr. Leonette Monarch, ER CHIEF COMPLAINT:  throat swelling   History of Present Illness:  57 yo female former smoker with hx of HTN started on lisinopril in January 2024 and the developed persistent cough.  In evening of 03/14/23 she developed difficulty swallowing with hoarseness.  She then felt her tongue was swelling and throat closing, and caused acute respiratory distress.  CT imaging in ER showed acute epiglottis/supraglottitis and narrowed airway 4 mm.  Given epinephrine, solumedrol, antibiotics, pepcid.  PCCM asked to admit to ICU.  Pertinent  Medical History  HTN, Colon polyp, Anxiety, Allergies, Depression  Significant Hospital Events: Including procedures, antibiotic start and stop dates in addition to other pertinent events   3/23 admit  Interim History / Subjective:  Overall feels better.  No dysphagia or dysphonia.  Still feels some swelling and tightness in neck but improving.  Still has a headache- c/o of pain in frontal /sinus area  Objective   Blood pressure 118/82, pulse 78, temperature 97.9 F (36.6 C), temperature source Oral, resp. rate 16, height 5\' 8"  (1.727 m), weight 96 kg, last menstrual period 09/03/2017, SpO2 98 %.        Intake/Output Summary (Last 24 hours) at 03/16/2023 1221 Last data filed at 03/16/2023 0900 Gross per 24 hour  Intake 700 ml  Output --  Net 700 ml   Filed Weights   03/15/23 0052 03/15/23 0538 03/16/23 0500  Weight: 97.5 kg 94.8 kg 96 kg    Examination: General:  Very pleasant adult female sitting in bed  HEENT: MM pink/moist, no obvious oral edema, voice clear, no stridor, some fullness and tenderness remains in neck Neuro: Aox4, MAE CV: rr, no murmur PULM:  non labored, CTA, occasional dry cough GI: soft, bs+, NT, voids Extremities: warm/dry, no LE edema  Skin: no rashes  Resolved Hospital  Problem list     Assessment & Plan:   Acute epiglottis/supraglottitis. - continues to improve, stable airway monitoring x 24hrs> transfer out of ICU today - cont ceftriaxone, day 2/x;  will need 5-7 days of abx, change to Augmentin on discharge - decadron 4 mg q6h x 28 hrs  ACE inhibitor cough. - lisinopril discontinued.  Remains normotensive.  Outpt f/u.  Avoid ACEi - tessalon prn   Hx of HTN. - goal BP normotensive  Frontal Headache - feel more sinus related.  Non-focal exam.  Resuming home allergy meds> flonase, claritin (xyzal not on formulary), nasal spray - tylenol, naproxen, floricet prn  - f/u with allergist outpt  Transfer out of ICU and to [facility] as of 3/25.    Best Practice (right click and "Reselect all SmartList Selections" daily)   Diet/type: Regular consistency (see orders) DVT prophylaxis: LMWH GI prophylaxis: H2B Lines: N/A Foley:  N/A Code Status:  full code Last date of multidisciplinary goals of care discussion [x]   Patient updated at bedside.    Labs       Latest Ref Rng & Units 03/16/2023   12:38 AM 03/15/2023    2:14 AM 03/15/2023    2:00 AM  CMP  Glucose 70 - 99 mg/dL 146  137  143   BUN 6 - 20 mg/dL 14  13  15    Creatinine 0.44 - 1.00 mg/dL 0.93  0.80  0.79   Sodium 135 - 145 mmol/L 136  140  138   Potassium 3.5 -  5.1 mmol/L 3.9  3.4  3.7   Chloride 98 - 111 mmol/L 106  102  104   CO2 22 - 32 mmol/L 22   26   Calcium 8.9 - 10.3 mg/dL 9.5   9.3        Latest Ref Rng & Units 03/15/2023    2:14 AM 03/15/2023    2:00 AM 12/13/2022    4:21 PM  CBC  WBC 4.0 - 10.5 K/uL  7.4  7.1   Hemoglobin 12.0 - 15.0 g/dL 12.9  12.2  13.4   Hematocrit 36.0 - 46.0 % 38.0  37.5  40.4   Platelets 150 - 400 K/uL  225  PLATELET CLUMPS NOTED ON SMEAR, COUNT APPEARS ADEQUATE     CBG (last 3)  Recent Labs    03/15/23 1535 03/15/23 1908 03/15/23 2301  GLUCAP 143* 139* 164*   Signature:      Kennieth Rad, MSN, AG-ACNP-BC [facility] 03/16/2023, 12:21 PM  See Amion for pager If no response to pager, please call PCCM consult pager After 7:00 pm call Elink

## 2023-03-17 DIAGNOSIS — J0510 Acute epiglottitis without obstruction: Secondary | ICD-10-CM | POA: Diagnosis not present

## 2023-03-17 DIAGNOSIS — T783XXD Angioneurotic edema, subsequent encounter: Secondary | ICD-10-CM

## 2023-03-17 LAB — GLUCOSE, CAPILLARY
Glucose-Capillary: 100 mg/dL — ABNORMAL HIGH (ref 70–99)
Glucose-Capillary: 113 mg/dL — ABNORMAL HIGH (ref 70–99)
Glucose-Capillary: 118 mg/dL — ABNORMAL HIGH (ref 70–99)
Glucose-Capillary: 124 mg/dL — ABNORMAL HIGH (ref 70–99)
Glucose-Capillary: 168 mg/dL — ABNORMAL HIGH (ref 70–99)

## 2023-03-17 MED ORDER — PREDNISONE 20 MG PO TABS
40.0000 mg | ORAL_TABLET | Freq: Once | ORAL | Status: AC
  Administered 2023-03-17: 40 mg via ORAL
  Filled 2023-03-17: qty 2

## 2023-03-17 NOTE — Progress Notes (Signed)
[facility]  PROGRESS NOTE  Deborah Stevens [id number] DOB: 11-Jul-1966 DOA: 03/15/2023 PCP: Leamon Arnt, MD   Brief HPI:   57 year old female, former smoker with history of hypertension, started on lisinopril in January 2024, developed persistent cough.  In the evening of 03/12/2023 she developed difficulty swallowing with hoarseness.  She felt her tongue was swelling and throat closing which caused acute respiratory distress.  CT imaging in the ER showed acute epiglottitis/supraglottitis, narrowed airway 4 mm.  Patient was given epinephrine, Solu-Medrol, antibiotics and Pepcid.  She was admitted to ICU, transferred from ICU to medical floor.    Subjective   Patient seen and examined, denies shortness of breath.  No difficulty swallowing.  Still feels swelling and tightness in the neck.   Assessment/Plan:    Acute epiglottitis/supraglottitis -Insetting of angioedema from ACE inhibitor -Improving -Patient was given Decadron 4 mg IV every 6 hours for 7 doses -Completed Decadron today, will start prednisone taper from today -Continue Rocephin, day #2 -Will discharge on Augmentin for total 7 days of therapy  ACE inhibitor induced cough -Improving, continue Tessalon Perles as needed  Allergic rhinitis -Continue Flonase, Claritin  Likely discharge home in a.m.  Medications     Chlorhexidine Gluconate Cloth  6 each Topical Q0600   enoxaparin (LOVENOX) injection  40 mg Subcutaneous Q24H   famotidine  20 mg Oral BID   fluticasone  2 spray Each Nare Daily   levocetirizine  5 mg Oral QPM     Data Reviewed:   CBG:  Recent Labs  Lab 03/16/23 2200 03/17/23 0007 03/17/23 0413 03/17/23 0741 03/17/23 1115  GLUCAP 137* 124* 118* 113* 100*    SpO2: 100 %    Vitals:   03/16/23 2158 03/17/23 0221 03/17/23 0602 03/17/23 0936  BP: (!) 104/56 (!) 100/55 113/69 122/72  Pulse: 60 (!) 57 66 70  Resp: 18 18 18 18   Temp: 98.1 F (36.7 C) 98.6 F (37 C) 98 F  (36.7 C) 98.2 F (36.8 C)  TempSrc: Oral Oral Oral Oral  SpO2: 100% 100% 99% 100%  Weight:      Height:          Data Reviewed:  Basic Metabolic Panel: Recent Labs  Lab 03/14/23 1155 03/15/23 0200 03/15/23 0214 03/16/23 0038  NA 142 138 140 136  K 4.8 3.7 3.4* 3.9  CL 103 104 102 106  CO2 33* 26  --  22  GLUCOSE 97 143* 137* 146*  BUN 13 15 13 14   CREATININE 0.83 0.79 0.80 0.93  CALCIUM 10.0 9.3  --  9.5    CBC: Recent Labs  Lab 03/15/23 0200 03/15/23 0214  WBC 7.4  --   NEUTROABS 3.6  --   HGB 12.2 12.9  HCT 37.5 38.0  MCV 92.1  --   PLT 225  --     LFT No results for input(s): "AST", "ALT", "ALKPHOS", "BILITOT", "PROT", "ALBUMIN" in the last 168 hours.   Antibiotics: Anti-infectives (From admission, onward)    Start     Dose/Rate Route Frequency Ordered Stop   03/15/23 0745  cefTRIAXone (ROCEPHIN) 1 g in sodium chloride 0.9 % 100 mL IVPB        1 g 200 mL/hr over 30 Minutes Intravenous Every 24 hours 03/15/23 0654          DVT prophylaxis: Lovenox  Code Status: Full code  Family Communication: No family at bedside   CONSULTS PCCM   Objective    Physical Examination:  General-appears in no acute distress Heart-S1-S2, regular, no murmur auscultated Lungs-clear to auscultation bilaterally, no wheezing or crackles auscultated Abdomen-soft, nontender, no organomegaly Extremities-no edema in the lower extremities Neuro-alert, oriented x3, no focal deficit noted   Status is: Inpatient:             Oswald Hillock   [facility] If 7PM-7AM, please contact night-coverage at [email or web address], Office  [phone]   03/17/2023, 4:09 PM  LOS: 2 days

## 2023-03-18 DIAGNOSIS — J0510 Acute epiglottitis without obstruction: Secondary | ICD-10-CM | POA: Diagnosis not present

## 2023-03-18 DIAGNOSIS — T783XXD Angioneurotic edema, subsequent encounter: Secondary | ICD-10-CM | POA: Diagnosis not present

## 2023-03-18 LAB — GLUCOSE, CAPILLARY: Glucose-Capillary: 97 mg/dL (ref 70–99)

## 2023-03-18 MED ORDER — BENZONATATE 200 MG PO CAPS: 200.0000 mg | ORAL_CAPSULE | Freq: Three times a day (TID) | ORAL | 0 refills | Status: DC | PRN

## 2023-03-18 MED ORDER — PREDNISONE 10 MG PO TABS: ORAL_TABLET | ORAL | 0 refills | Status: DC

## 2023-03-18 MED ORDER — AMOXICILLIN-POT CLAVULANATE 875-125 MG PO TABS: 1.0000 | ORAL_TABLET | Freq: Two times a day (BID) | ORAL | 0 refills | Status: DC

## 2023-03-18 MED ORDER — FAMOTIDINE 20 MG PO TABS: 20.0000 mg | ORAL_TABLET | Freq: Two times a day (BID) | ORAL | 0 refills | Status: DC

## 2023-03-18 NOTE — Discharge Instructions (Addendum)
Do not take lisinopril as it has caused swelling of your throat.

## 2023-03-18 NOTE — Discharge Summary (Addendum)
Physician Discharge Summary   Patient: Deborah Stevens MRN: [id number] DOB: [birth year]  Admit date:     03/15/2023  Discharge date: 03/18/23  Discharge Physician: Oswald Hillock   PCP: Leamon Arnt, MD   Recommendations at discharge:   Follow-up PCP in 1 week Continue Augmentin 1 tablet p.o. twice daily for 5 days Continue prednisone taper for 4 more days and then stop Do not take ACE inhibitor/lisinopril as it does cause angioedema/epiglottitis  Discharge Diagnoses: Principal Problem:   Acute epiglottitis Active Problems:   Angio-edema   Epiglottitis  Resolved Problems:   * No resolved hospital problems. *  Hospital Course:  57 year old female, former smoker with history of hypertension, started on lisinopril in January 2024, developed persistent cough.  In the evening of 03/12/2023 she developed difficulty swallowing with hoarseness.  She felt her tongue was swelling and throat closing which caused acute respiratory distress.  CT imaging in the ER showed acute epiglottitis/supraglottitis, narrowed airway 4 mm.  Patient was given epinephrine, Solu-Medrol, antibiotics and Pepcid.  She was admitted to ICU, transferred from ICU to medical floor.   Assessment and Plan:  Acute epiglottitis/supraglottitis -Insetting of angioedema from ACE inhibitor -Improving -Patient was given Decadron 4 mg IV every 6 hours for 7 doses -She was started on IV Rocephin in the hospital.   -Completed Decadron in the hospital -Will get charged on prednisone taper for 4 more days -Will discharge on Augmentin for 5 more days   ACE inhibitor induced cough -Improving, continue Tessalon Perles as needed   Allergic rhinitis -Continue Flonase, Claritin          Consultants: CCM Procedures performed:  Disposition: Home Diet recommendation:  Discharge Diet Orders (From admission, onward)     Start     Ordered   03/18/23 0000  Diet - low sodium heart healthy        03/18/23 0938            Regular diet DISCHARGE MEDICATION: Allergies as of 03/18/2023       Reactions   Ace Inhibitors Cough   Angioedema   Maxzide [hydrochlorothiazide W-triamterene] Other (See Comments)   hypokalemia        Medication List     TAKE these medications    amoxicillin-clavulanate 875-125 MG tablet Commonly known as: AUGMENTIN Take 1 tablet by mouth 2 (two) times daily.   azelastine 0.05 % ophthalmic solution Commonly known as: OPTIVAR Place 1 drop into both eyes 2 (two) times daily as needed (allergies).   benzonatate 200 MG capsule Commonly known as: TESSALON Take 1 capsule (200 mg total) by mouth 3 (three) times daily as needed for cough.   Cheratussin AC 100-10 MG/5ML syrup Generic drug: guaiFENesin-codeine Take 5 mLs by mouth 3 (three) times daily as needed for cough (Do not drive while taking this medication.).   cholecalciferol 25 MCG (1000 UNIT) tablet Commonly known as: VITAMIN D3 Take 1,000 Units by mouth daily.   diclofenac 75 MG EC tablet Commonly known as: VOLTAREN Take 75 mg by mouth 2 (two) times daily.   EPINEPHrine 0.3 mg/0.3 mL Soaj injection Commonly known as: EPI-PEN Inject 0.3 mg into the muscle as needed for anaphylaxis.   famotidine 20 MG tablet Commonly known as: PEPCID Take 1 tablet (20 mg total) by mouth 2 (two) times daily.   ipratropium 0.06 % nasal spray Commonly known as: ATROVENT Place 1 spray into both nostrils daily as needed (allergies).   levocetirizine 5 MG tablet Commonly known as:  XYZAL Take 5 mg by mouth every evening.   potassium chloride SA 20 MEQ tablet Commonly known as: KLOR-CON M Take 1 tablet (20 mEq total) by mouth 2 (two) times daily.   predniSONE 10 MG tablet Commonly known as: DELTASONE Prednisone 30 mg po daily x 1 day then Prednisone 20 mg po daily x 1 day then Prednisone 10 mg daily x 1 day then stop...   triamcinolone cream 0.1 % Commonly known as: KENALOG APPLY TO AFFECTED AREA(S) TWICE DAILY FOR  2 WEEKS THEN AS NEEDED        Follow-up Information     Leamon Arnt, MD Follow up in 1 week(s).   Specialty: Family Medicine Contact information: [address] [place] [address] [phone]                Discharge Exam: Danley Danker Weights   03/15/23 0538 03/16/23 0500 03/16/23 1420  Weight: 94.8 kg 96 kg 97.3 kg   General-appears in no acute distress Heart-S1-S2, regular, no murmur auscultated Lungs-clear to auscultation bilaterally, no wheezing or crackles auscultated Abdomen-soft, nontender, no organomegaly Extremities-no edema in the lower extremities Neuro-alert, oriented x3, no focal deficit noted  Condition at discharge: good  The results of significant diagnostics from this hospitalization (including imaging, microbiology, ancillary and laboratory) are listed below for reference.   Imaging Studies: DG CHEST PORT 1 VIEW  Result Date: 03/15/2023 CLINICAL DATA:  Respiratory distress. EXAM: PORTABLE CHEST 1 VIEW COMPARISON:  12/13/2022 FINDINGS: The lungs are clear without focal pneumonia, edema, pneumothorax or pleural effusion. The cardiopericardial silhouette is within normal limits for size. The visualized bony structures of the thorax are unremarkable. Telemetry leads overlie the chest. IMPRESSION: No active disease. Electronically Signed   By: Misty Stanley M.D.   On: 03/15/2023 07:29   CT Soft Tissue Neck W Contrast  Result Date: 03/15/2023 CLINICAL DATA:  Initial evaluation for soft tissue infection suspected. EXAM: CT NECK WITH CONTRAST TECHNIQUE: Multidetector CT imaging of the neck was performed using the standard protocol following the bolus administration of intravenous contrast. RADIATION DOSE REDUCTION: This exam was performed according to the departmental dose-optimization program which includes automated exposure control, adjustment of the mA and/or kV according to patient size and/or use of iterative reconstruction technique.  CONTRAST:  76mL OMNIPAQUE IOHEXOL 300 MG/ML  SOLN COMPARISON:  None Available. FINDINGS: Pharynx and larynx: Oral cavity within normal limits. Palatine tonsils are somewhat poorly defined and edematous in appearance. No discrete tonsillar or peritonsillar abscess. Mucosal thickening and edema seen about the epiglottis. Edema seen involving the right greater than left aryepiglottic folds, with additional mucosal edema about the adjacent oropharynx. Findings are consistent with acute supraglottitis. Supraglottic airway is attenuated and narrowed measuring 4 mm in diameter at its most narrow point. Associated hazy stranding and probable small effusion within the retropharyngeal space without discrete abscess. No other drainable fluid collection elsewhere. Hazy inflammatory stranding extends laterally to involve the parapharyngeal and submandibular spaces as well. Inferiorly, the glottis is closed but grossly symmetric and within normal limits. Subglottic airway patent clear. Salivary glands: Salivary glands including the parotid and submandibular glands are within normal limits. Thyroid: Few subcentimeter nodules noted within the left lobe of thyroid, largest of which measures 5 mm. These are of doubtful significance given size and patient age, with no follow-up imaging recommended (ref: J Am Coll Radiol. 2015 Feb;12(2): 143-50). Lymph nodes: No enlarged or pathologic adenopathy seen within the neck. Vascular: Normal intravascular enhancement seen throughout the  neck. Limited intracranial: Unremarkable. Visualized orbits: Unremarkable. Mastoids and visualized paranasal sinuses: Paranasal sinuses are largely clear. Mastoid air cells and middle ear cavities are well pneumatized and free of fluid. Skeleton: No discrete or worrisome osseous lesions. Moderate degenerative spondylosis present at C3-4 through C6-7. Upper chest: Visualized upper chest demonstrates no acute finding. Other: None. IMPRESSION: 1. Findings  consistent with acute epiglottitis/supraglottitis. Supraglottic airway is attenuated and narrowed measuring 4 mm in diameter at its most narrow point. Close clinical monitoring recommended as this patient is potentially at risk for developing airway compromise. No discrete abscess or drainable fluid collection. 2. Moderate degenerative spondylosis at C3-4 through C6-7. Electronically Signed   By: Jeannine Boga M.D.   On: 03/15/2023 03:21    Microbiology: Results for orders placed or performed during the hospital encounter of 03/15/23  Group A Strep by PCR     Status: None   Collection Time: 03/15/23  2:23 AM   Specimen: Throat; Sterile Swab  Result Value Ref Range Status   Group A Strep by PCR NOT DETECTED NOT DETECTED Final    Comment: Performed at [facility], [place] [address], [place], [place] [address]  Resp panel by RT-PCR (RSV, Flu A&B, Covid) Throat     Status: None   Collection Time: 03/15/23  2:23 AM   Specimen: Throat; Nasal Swab  Result Value Ref Range Status   SARS Coronavirus 2 by RT PCR NEGATIVE NEGATIVE Final    Comment: (NOTE) SARS-CoV-2 target nucleic acids are NOT DETECTED.  The SARS-CoV-2 RNA is generally detectable in upper respiratory specimens during the acute phase of infection. The lowest concentration of SARS-CoV-2 viral copies this assay can detect is 138 copies/mL. A negative result does not preclude SARS-Cov-2 infection and should not be used as the sole basis for treatment or other patient management decisions. A negative result may occur with  improper specimen collection/handling, submission of specimen other than nasopharyngeal swab, presence of viral mutation(s) within the areas targeted by this assay, and inadequate number of viral copies(<138 copies/mL). A negative result must be combined with clinical observations, patient history, and epidemiological information. The expected result is Negative.  Fact Sheet for Patients:   EntrepreneurPulse.com.au  Fact Sheet for Healthcare Providers:  IncredibleEmployment.be  This test is no t yet approved or cleared by the [place] FDA and  has been authorized for detection and/or diagnosis of SARS-CoV-2 by FDA under an Emergency Use Authorization (EUA). This EUA will remain  in effect (meaning this test can be used) for the duration of the COVID-19 declaration under Section 564(b)(1) of the Act, 21 U.S.C.section 360bbb-3(b)(1), unless the authorization is terminated  or revoked sooner.       Influenza A by PCR NEGATIVE NEGATIVE Final   Influenza B by PCR NEGATIVE NEGATIVE Final    Comment: (NOTE) The Xpert Xpress SARS-CoV-2/FLU/RSV plus assay is intended as an aid in the diagnosis of influenza from Nasopharyngeal swab specimens and should not be used as a sole basis for treatment. Nasal washings and aspirates are unacceptable for Xpert Xpress SARS-CoV-2/FLU/RSV testing.  Fact Sheet for Patients: EntrepreneurPulse.com.au  Fact Sheet for Healthcare Providers: IncredibleEmployment.be  This test is not yet approved or cleared by the [place] FDA and has been authorized for detection and/or diagnosis of SARS-CoV-2 by FDA under an Emergency Use Authorization (EUA). This EUA will remain in effect (meaning this test can be used) for the duration of the COVID-19 declaration under Section 564(b)(1) of the Act, 21 U.S.C. section  360bbb-3(b)(1), unless the authorization is terminated or revoked.     Resp Syncytial Virus by PCR NEGATIVE NEGATIVE Final    Comment: (NOTE) Fact Sheet for Patients: EntrepreneurPulse.com.au  Fact Sheet for Healthcare Providers: IncredibleEmployment.be  This test is not yet approved or cleared by the [place] FDA and has been authorized for detection and/or diagnosis of SARS-CoV-2 by FDA under an Emergency Use  Authorization (EUA). This EUA will remain in effect (meaning this test can be used) for the duration of the COVID-19 declaration under Section 564(b)(1) of the Act, 21 U.S.C. section 360bbb-3(b)(1), unless the authorization is terminated or revoked.  Performed at [facility], [place] [address] [place], [place] [address]   MRSA Next Gen by PCR, Nasal     Status: None   Collection Time: 03/15/23  5:37 AM   Specimen: Nasal Mucosa; Nasal Swab  Result Value Ref Range Status   MRSA by PCR Next Gen NOT DETECTED NOT DETECTED Final    Comment: (NOTE) The GeneXpert MRSA Assay (FDA approved for NASAL specimens only), is one component of a comprehensive MRSA colonization surveillance program. It is not intended to diagnose MRSA infection nor to guide or monitor treatment for MRSA infections. Test performance is not FDA approved in patients less than 55 years old. Performed at [facility], [place] [address] [place], [place] [address]     Labs: CBC: Recent Labs  Lab 03/15/23 0200 03/15/23 0214  WBC 7.4  --   NEUTROABS 3.6  --   HGB 12.2 12.9  HCT 37.5 38.0  MCV 92.1  --   PLT 225  --    Basic Metabolic Panel: Recent Labs  Lab 03/14/23 1155 03/15/23 0200 03/15/23 0214 03/16/23 0038  NA 142 138 140 136  K 4.8 3.7 3.4* 3.9  CL 103 104 102 106  CO2 33* 26  --  22  GLUCOSE 97 143* 137* 146*  BUN 13 15 13 14   CREATININE 0.83 0.79 0.80 0.93  CALCIUM 10.0 9.3  --  9.5   Liver Function Tests: No results for input(s): "AST", "ALT", "ALKPHOS", "BILITOT", "PROT", "ALBUMIN" in the last 168 hours. CBG: Recent Labs  Lab 03/17/23 0413 03/17/23 0741 03/17/23 1115 03/17/23 1649 03/18/23 0731  GLUCAP 118* 113* 100* 168* 97    Discharge time spent: greater than 30 minutes.  Signed: Oswald Hillock, MD [facility] 03/18/2023

## 2023-03-18 NOTE — TOC Transition Note (Signed)
Transition of Care [facility]) - CM/SW Discharge Note   Patient Details  Name: Deborah Stevens MRN: [id number] Date of Birth: August 09, 1966  Transition of Care [facility]) CM/SW Contact:  Tom-Johnson, Renea Ee, RN Phone Number: 03/18/2023, 10:49 AM   Clinical Narrative:     Patient is scheduled for discharge today. Readmission Prevention Assessment done. Outpatient referrals, hospital f/u and discharge instructions on AVS.  Family to transport at discharge. No further TOC needs noted.    Final next level of care: Home/Self Care Barriers to Discharge: Barriers Resolved   Patient Goals and CMS Choice CMS Medicare.gov Compare Post Acute Care list provided to:: Patient Choice offered to / list presented to : NA  Discharge Placement                  Patient to be transferred to facility by: Family      Discharge Plan and Services Additional resources added to the After Visit Summary for                  DME Arranged: N/A DME Agency: NA       HH Arranged: NA HH Agency: NA        Social Determinants of Health (SDOH) Interventions SDOH Screenings   Food Insecurity: Food Insecurity Present (03/16/2023)  Housing: Low Risk  (03/16/2023)  Transportation Needs: No Transportation Needs (03/16/2023)  Utilities: Not At Risk (03/16/2023)  Alcohol Screen: Low Risk  (03/14/2023)  Depression (PHQ2-9): Low Risk  (03/14/2023)  Financial Resource Strain: High Risk (03/14/2023)  Physical Activity: Insufficiently Active (03/14/2023)  Social Connections: Unknown (03/14/2023)  Stress: No Stress Concern Present (03/14/2023)  Tobacco Use: Medium Risk (03/16/2023)     Readmission Risk Interventions    03/18/2023   10:46 AM  Readmission Risk Prevention Plan  Post Dischage Appt Complete  Medication Screening Complete  Transportation Screening Complete

## 2023-03-18 NOTE — Progress Notes (Signed)
RN received home med, Levoctirine, from pharmacy and given to pt.

## 2023-03-18 NOTE — Plan of Care (Signed)

## 2023-03-19 ENCOUNTER — Telehealth: Payer: Self-pay

## 2023-03-19 NOTE — Transitions of Care (Post Inpatient/ED Visit) (Signed)
   03/19/2023  Name: Deborah Stevens MRN: [id number] DOB: [birth year]  Today's TOC FU Call Status: Today's TOC FU Call Status:: Unsuccessul Call (1st Attempt) Unsuccessful Call (1st Attempt) Date: 03/19/23  Attempted to reach the patient regarding the most recent Inpatient/ED visit.  Follow Up Plan: Additional outreach attempts will be made to reach the patient to complete the Transitions of Care (Post Inpatient/ED visit) call.      Enzo Montgomery, RN,BSN,CCM [facility] Care Management Care Management Community Coordinator Direct Phone: [phone] Toll Free: [phone] Fax: [phone]

## 2023-03-20 ENCOUNTER — Telehealth: Payer: Self-pay

## 2023-03-20 NOTE — Transitions of Care (Post Inpatient/ED Visit) (Signed)
   03/20/2023  Name: Lenorah Sobie MRN: [id number] DOB: [birth year]  Today's TOC FU Call Status: Today's TOC FU Call Status:: Unsuccessful Call (3rd Attempt) Unsuccessful Call (3rd Attempt) Date: 03/20/23  Attempted to reach the patient regarding the most recent Inpatient/ED visit.  Follow Up Plan: No further outreach attempts will be made at this time. We have been unable to contact the patient.    Enzo Montgomery, RN,BSN,CCM [facility] Care Management Care Management Community Coordinator Direct Phone: [phone] Toll Free: [phone] Fax: [phone]

## 2023-03-20 NOTE — Transitions of Care (Post Inpatient/ED Visit) (Signed)
   03/20/2023  Name: Ilima Gritz MRN: [id number] DOB: 06/13/1966  Today's TOC FU Call Status: Today's TOC FU Call Status:: Unsuccessful Call (2nd Attempt) Unsuccessful Call (2nd Attempt) Date: 03/20/23  Attempted to reach the patient regarding the most recent Inpatient/ED visit.  Follow Up Plan: Additional outreach attempts will be made to reach the patient to complete the Transitions of Care (Post Inpatient/ED visit) call.      Enzo Montgomery, RN,BSN,CCM [facility] Care Management Care Management Community Coordinator Direct Phone: [phone] Toll Free: [phone] Fax: [phone]

## 2023-03-20 NOTE — Transitions of Care (Post Inpatient/ED Visit) (Signed)
   03/20/2023  Name: Rennie Hefter MRN: [id number] DOB: 02-06-1966  Today's TOC FU Call Status: Today's TOC FU Call Status:: Successful TOC FU Call Competed TOC FU Call Complete Date: 03/20/23 (Incoming call from pt returning RN CM call)  Transition Care Management Follow-up Telephone Call Date of Discharge: 03/18/23 Discharge Facility: [facility]) Type of Discharge: Inpatient Admission Primary Inpatient Discharge Diagnosis:: "angioedema, epiglottis" How have you been since you were released from the hospital?: Better (Patient states she is "doing much better-the people at the hospital saved my life." Patient states she started having diarrhea on Wed. -try some OTC anti-diarrhea med -aware to f/u with MD sxs worsens and/or unresolved) Any questions or concerns?: No  Items Reviewed: Did you receive and understand the discharge instructions provided?: No Any new allergies since your discharge?: No Dietary orders reviewed?: Yes Type of Diet Ordered:: low salt/heart healthy Do you have support at home?: No  Home Care and Equipment/Supplies: [place] Ordered?: NA Any new equipment or medical supplies ordered?: NA  Functional Questionnaire: Do you need assistance with bathing/showering or dressing?: No Do you need assistance with meal preparation?: No Do you need assistance with eating?: No Do you have difficulty maintaining continence: No Do you need assistance with getting out of bed/getting out of a chair/moving?: No Do you have difficulty managing or taking your medications?: No  Follow up appointments reviewed: PCP Follow-up appointment confirmed?: Yes Date of PCP follow-up appointment?: 03/31/23 Follow-up Provider: Dr. Jonni Sanger Specialist [facility] Follow-up appointment confirmed?: NA Do you need transportation to your follow-up appointment?: No Do you understand care options if your condition(s) worsen?: Yes-patient verbalized  understanding    Interventions Today    Flowsheet Row Most Recent Value  General Interventions   General Interventions Discussed/Reviewed General Interventions Discussed, Doctor Visits  Doctor Visits Discussed/Reviewed Doctor Visits Discussed, PCP  PCP/Specialist Visits Compliance with follow-up visit  [pt wanted to change PCP f/u appt to sooner date as she is going out of town-care guides assisted but provider had no openings-pt will call office to discuss as she did not want to see another provider]  Education Interventions   Education Provided Provided Education  Provided Verbal Education On Nutrition, When to see the doctor, Medication, Other  Nutrition Interventions   Nutrition Discussed/Reviewed Nutrition Discussed, Fluid intake  Pharmacy Interventions   Pharmacy Dicussed/Reviewed Pharmacy Topics Discussed, Medications and their functions        Hetty Blend [facility] Care Management Care Management Community Coordinator Direct Phone: [phone] Toll Free: [phone] Fax: [phone]

## 2023-03-23 LAB — ALDOSTERONE + RENIN ACTIVITY W/ RATIO
ALDO / PRA Ratio: 2.7 Ratio (ref 0.9–28.9)
Aldosterone: 6 ng/dL
Renin Activity: 2.23 ng/mL/h (ref 0.25–5.82)

## 2023-03-24 ENCOUNTER — Encounter: Payer: Self-pay | Admitting: Family Medicine

## 2023-03-24 ENCOUNTER — Ambulatory Visit: Payer: BC Managed Care – PPO | Admitting: Family Medicine

## 2023-03-24 VITALS — BP 122/72 | HR 83 | Temp 98.2°F | Ht 68.0 in | Wt 219.8 lb

## 2023-03-24 DIAGNOSIS — T783XXA Angioneurotic edema, initial encounter: Secondary | ICD-10-CM | POA: Diagnosis not present

## 2023-03-24 DIAGNOSIS — T464X5A Adverse effect of angiotensin-converting-enzyme inhibitors, initial encounter: Secondary | ICD-10-CM | POA: Diagnosis not present

## 2023-03-24 DIAGNOSIS — I10 Essential (primary) hypertension: Secondary | ICD-10-CM

## 2023-03-24 DIAGNOSIS — E876 Hypokalemia: Secondary | ICD-10-CM
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
## 2023-03-24 NOTE — Patient Instructions (Signed)
Please return in 2-3 weeks to recheck your blood pressure and potassium levels.   If you have any questions or concerns, please don't hesitate to send me a message via MyChart or call the office at [phone]. Thank you for visiting with [place] today! It's our pleasure caring for you.

## 2023-03-31 ENCOUNTER — Ambulatory Visit: Payer: BC Managed Care – PPO | Admitting: Family Medicine

## 2023-04-08 ENCOUNTER — Ambulatory Visit: Payer: BC Managed Care – PPO | Admitting: Family Medicine

## 2023-04-16 DIAGNOSIS — J3081 Allergic rhinitis due to animal (cat) (dog) hair and dander: Secondary | ICD-10-CM | POA: Diagnosis not present

## 2023-04-16 DIAGNOSIS — J301 Allergic rhinitis due to pollen: Secondary | ICD-10-CM | POA: Diagnosis not present

## 2023-04-16 DIAGNOSIS — J3089 Other allergic rhinitis: Secondary | ICD-10-CM | POA: Diagnosis not present

## 2023-04-21 ENCOUNTER — Ambulatory Visit: Payer: BC Managed Care – PPO | Admitting: Family Medicine

## 2023-04-21 ENCOUNTER — Encounter: Payer: Self-pay | Admitting: Family Medicine

## 2023-04-21 VITALS — BP 118/84 | HR 69 | Temp 97.6°F | Ht 68.0 in | Wt 222.2 lb

## 2023-04-21 DIAGNOSIS — I10 Essential (primary) hypertension: Secondary | ICD-10-CM | POA: Diagnosis not present

## 2023-04-21 DIAGNOSIS — T502X5A Adverse effect of carbonic-anhydrase inhibitors, benzothiadiazides and other diuretics, initial encounter: Secondary | ICD-10-CM | POA: Diagnosis not present

## 2023-04-21 DIAGNOSIS — E876 Hypokalemia: Secondary | ICD-10-CM | POA: Diagnosis not present

## 2023-04-21 LAB — BASIC METABOLIC PANEL
BUN: 11 mg/dL (ref 6–23)
CO2: 24 mEq/L (ref 19–32)
Calcium: 9.9 mg/dL (ref 8.4–10.5)
Chloride: 106 mEq/L (ref 96–112)
Creatinine, Ser: 0.76 mg/dL (ref 0.40–1.20)
GFR: 87.44 mL/min (ref >60.00)
Glucose, Bld: 86 mg/dL (ref 70–99)
Potassium: 4.6 mEq/L (ref 3.5–5.1)
Sodium: 140 mEq/L (ref 135–145)
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
## 2023-04-22 DIAGNOSIS — J3081 Allergic rhinitis due to animal (cat) (dog) hair and dander: Secondary | ICD-10-CM | POA: Diagnosis not present

## 2023-04-22 DIAGNOSIS — J301 Allergic rhinitis due to pollen: Secondary | ICD-10-CM | POA: Diagnosis not present

## 2023-04-22 DIAGNOSIS — M65831 Other synovitis and tenosynovitis, right forearm: Secondary | ICD-10-CM | POA: Diagnosis not present

## 2023-04-22 DIAGNOSIS — J3089 Other allergic rhinitis: Secondary | ICD-10-CM | POA: Diagnosis not present

## 2023-04-22 DIAGNOSIS — M67431 Ganglion, right wrist: Secondary | ICD-10-CM | POA: Diagnosis not present

## 2023-04-24 DIAGNOSIS — J301 Allergic rhinitis due to pollen: Secondary | ICD-10-CM | POA: Diagnosis not present

## 2023-04-24 DIAGNOSIS — J3089 Other allergic rhinitis: Secondary | ICD-10-CM | POA: Diagnosis not present

## 2023-04-24 DIAGNOSIS — J3081 Allergic rhinitis due to animal (cat) (dog) hair and dander: Secondary | ICD-10-CM | POA: Diagnosis not present

## 2023-04-25 ENCOUNTER — Other Ambulatory Visit: Payer: Self-pay | Admitting: Family Medicine

## 2023-04-28 DIAGNOSIS — J301 Allergic rhinitis due to pollen: Secondary | ICD-10-CM | POA: Diagnosis not present

## 2023-04-28 DIAGNOSIS — J3089 Other allergic rhinitis: Secondary | ICD-10-CM | POA: Diagnosis not present

## 2023-04-28 DIAGNOSIS — J3081 Allergic rhinitis due to animal (cat) (dog) hair and dander: Secondary | ICD-10-CM | POA: Diagnosis not present

## 2023-04-30 DIAGNOSIS — J301 Allergic rhinitis due to pollen: Secondary | ICD-10-CM | POA: Diagnosis not present

## 2023-04-30 DIAGNOSIS — J3081 Allergic rhinitis due to animal (cat) (dog) hair and dander: Secondary | ICD-10-CM | POA: Diagnosis not present

## 2023-04-30 DIAGNOSIS — J3089 Other allergic rhinitis: Secondary | ICD-10-CM | POA: Diagnosis not present

## 2023-05-01 ENCOUNTER — Encounter (HOSPITAL_BASED_OUTPATIENT_CLINIC_OR_DEPARTMENT_OTHER): Payer: Self-pay | Admitting: Orthopedic Surgery

## 2023-05-01 ENCOUNTER — Other Ambulatory Visit: Payer: Self-pay

## 2023-05-02 DIAGNOSIS — J3081 Allergic rhinitis due to animal (cat) (dog) hair and dander: Secondary | ICD-10-CM | POA: Diagnosis not present

## 2023-05-02 DIAGNOSIS — J301 Allergic rhinitis due to pollen: Secondary | ICD-10-CM | POA: Diagnosis not present

## 2023-05-02 DIAGNOSIS — J3089 Other allergic rhinitis: Secondary | ICD-10-CM | POA: Diagnosis not present

## 2023-05-05 DIAGNOSIS — J3089 Other allergic rhinitis: Secondary | ICD-10-CM | POA: Diagnosis not present

## 2023-05-05 DIAGNOSIS — J3081 Allergic rhinitis due to animal (cat) (dog) hair and dander: Secondary | ICD-10-CM | POA: Diagnosis not present

## 2023-05-05 DIAGNOSIS — J301 Allergic rhinitis due to pollen: Secondary | ICD-10-CM | POA: Diagnosis not present

## 2023-05-06 NOTE — Anesthesia Preprocedure Evaluation (Signed)
Anesthesia Evaluation  Patient identified by MRN, date of birth, ID band Patient awake    Reviewed: Allergy & Precautions, H&P , NPO status , Patient's Chart, lab work & pertinent test results  Airway Mallampati: III  TM Distance: >3 FB Neck ROM: Full    Dental no notable dental hx. (+) Teeth Intact, Dental Advisory Given   Pulmonary neg pulmonary ROS, former smoker   Pulmonary exam normal breath sounds clear to auscultation       Cardiovascular Exercise Tolerance: Good hypertension,  Rhythm:Regular Rate:Normal     Neuro/Psych   Anxiety Depression    negative neurological ROS     GI/Hepatic negative GI ROS, Neg liver ROS,,,  Endo/Other  negative endocrine ROS    Renal/GU negative Renal ROS  negative genitourinary   Musculoskeletal  (+) Arthritis , Osteoarthritis,    Abdominal   Peds  Hematology negative hematology ROS (+)   Anesthesia Other Findings   Reproductive/Obstetrics negative OB ROS                             Anesthesia Physical Anesthesia Plan  ASA: 2  Anesthesia Plan: MAC and Regional   Post-op Pain Management: Tylenol PO (pre-op)*   Induction: Intravenous  PONV Risk Score and Plan: 2 and Propofol infusion, Midazolam and Ondansetron  Airway Management Planned: Natural Airway and Simple Face Mask  Additional Equipment:   Intra-op Plan:   Post-operative Plan:   Informed Consent: I have reviewed the patients History and Physical, chart, labs and discussed the procedure including the risks, benefits and alternatives for the proposed anesthesia with the patient or authorized representative who has indicated his/her understanding and acceptance.     Dental advisory given  Plan Discussed with: CRNA  Anesthesia Plan Comments:        Anesthesia Quick Evaluation

## 2023-05-07 ENCOUNTER — Encounter (HOSPITAL_BASED_OUTPATIENT_CLINIC_OR_DEPARTMENT_OTHER)
Admission: RE | Disposition: A | Discharge status: Home / Self Care | Payer: Self-pay | Source: Home / Self Care | Attending: Orthopedic Surgery

## 2023-05-07 ENCOUNTER — Encounter (HOSPITAL_BASED_OUTPATIENT_CLINIC_OR_DEPARTMENT_OTHER): Payer: Self-pay | Admitting: Orthopedic Surgery

## 2023-05-07 ENCOUNTER — Ambulatory Visit (HOSPITAL_BASED_OUTPATIENT_CLINIC_OR_DEPARTMENT_OTHER)
Admission: RE | Admit: 2023-05-07 | Discharge: 2023-05-07 | Disposition: A | Discharge status: Home / Self Care | Payer: BC Managed Care – PPO | Attending: Orthopedic Surgery | Admitting: Orthopedic Surgery

## 2023-05-07 ENCOUNTER — Other Ambulatory Visit: Payer: Self-pay

## 2023-05-07 ENCOUNTER — Ambulatory Visit (HOSPITAL_BASED_OUTPATIENT_CLINIC_OR_DEPARTMENT_OTHER): Payer: BC Managed Care – PPO | Admitting: Anesthesiology

## 2023-05-07 DIAGNOSIS — Z5941 Food insecurity: Secondary | ICD-10-CM | POA: Diagnosis not present

## 2023-05-07 DIAGNOSIS — Z87891 Personal history of nicotine dependence: Secondary | ICD-10-CM | POA: Diagnosis not present

## 2023-05-07 DIAGNOSIS — I10 Essential (primary) hypertension: Secondary | ICD-10-CM | POA: Diagnosis not present

## 2023-05-07 DIAGNOSIS — M67431 Ganglion, right wrist: Secondary | ICD-10-CM | POA: Diagnosis not present

## 2023-05-07 DIAGNOSIS — Z5986 Financial insecurity: Secondary | ICD-10-CM | POA: Diagnosis not present

## 2023-05-07 DIAGNOSIS — Z01818 Encounter for other preprocedural examination: Secondary | ICD-10-CM

## 2023-05-07 DIAGNOSIS — M67441 Ganglion, right hand: Secondary | ICD-10-CM | POA: Insufficient documentation

## 2023-05-07 DIAGNOSIS — M65831 Other synovitis and tenosynovitis, right forearm: Secondary | ICD-10-CM | POA: Diagnosis not present

## 2023-05-07 DIAGNOSIS — M659 Synovitis and tenosynovitis, unspecified: Secondary | ICD-10-CM | POA: Diagnosis not present

## 2023-05-07 HISTORY — PX: GANGLION CYST EXCISION: SHX1691

## 2023-05-07 SURGERY — EXCISION, GANGLION CYST, WRIST
Anesthesia: Monitor Anesthesia Care | Site: Wrist | Laterality: Right

## 2023-05-07 MED ORDER — ACETAMINOPHEN 500 MG PO TABS
1000.0000 mg | ORAL_TABLET | Freq: Once | ORAL | Status: AC
  Administered 2023-05-07: 1000 mg via ORAL

## 2023-05-07 MED ORDER — LACTATED RINGERS IV SOLN: INTRAVENOUS | Status: DC

## 2023-05-07 MED ORDER — CEFAZOLIN SODIUM 1 G IJ SOLR
INTRAMUSCULAR | Status: AC
  Filled 2023-05-07: qty 20

## 2023-05-07 MED ORDER — ONDANSETRON HCL 4 MG/2ML IJ SOLN
INTRAMUSCULAR | Status: DC | PRN
  Administered 2023-05-07: 4 mg via INTRAVENOUS

## 2023-05-07 MED ORDER — 0.9 % SODIUM CHLORIDE (POUR BTL) OPTIME
TOPICAL | Status: DC | PRN
  Administered 2023-05-07: 300 mL

## 2023-05-07 MED ORDER — PROPOFOL 500 MG/50ML IV EMUL
INTRAVENOUS | Status: AC
  Filled 2023-05-07: qty 50

## 2023-05-07 MED ORDER — PHENYLEPHRINE 80 MCG/ML (10ML) SYRINGE FOR IV PUSH (FOR BLOOD PRESSURE SUPPORT)
PREFILLED_SYRINGE | INTRAVENOUS | Status: AC
  Filled 2023-05-07: qty 10

## 2023-05-07 MED ORDER — HYDROMORPHONE HCL 1 MG/ML IJ SOLN: 0.2500 mg | INTRAMUSCULAR | Status: DC | PRN

## 2023-05-07 MED ORDER — BUPIVACAINE HCL (PF) 0.25 % IJ SOLN
INTRAMUSCULAR | Status: AC
  Filled 2023-05-07: qty 30

## 2023-05-07 MED ORDER — FENTANYL CITRATE (PF) 100 MCG/2ML IJ SOLN
INTRAMUSCULAR | Status: AC
  Filled 2023-05-07: qty 2

## 2023-05-07 MED ORDER — PHENYLEPHRINE 80 MCG/ML (10ML) SYRINGE FOR IV PUSH (FOR BLOOD PRESSURE SUPPORT)
PREFILLED_SYRINGE | INTRAVENOUS | Status: DC | PRN
  Administered 2023-05-07: 160 ug via INTRAVENOUS

## 2023-05-07 MED ORDER — MIDAZOLAM HCL 2 MG/2ML IJ SOLN
2.0000 mg | Freq: Once | INTRAMUSCULAR | Status: AC
  Administered 2023-05-07: 2 mg via INTRAVENOUS

## 2023-05-07 MED ORDER — FENTANYL CITRATE (PF) 100 MCG/2ML IJ SOLN
100.0000 ug | Freq: Once | INTRAMUSCULAR | Status: AC
  Administered 2023-05-07: 100 ug via INTRAVENOUS

## 2023-05-07 MED ORDER — MIDAZOLAM HCL 2 MG/2ML IJ SOLN
INTRAMUSCULAR | Status: AC
  Filled 2023-05-07: qty 2

## 2023-05-07 MED ORDER — BUPIVACAINE-EPINEPHRINE (PF) 0.5% -1:200000 IJ SOLN
INTRAMUSCULAR | Status: DC | PRN
  Administered 2023-05-07: 30 mL via PERINEURAL

## 2023-05-07 MED ORDER — PROPOFOL 500 MG/50ML IV EMUL
INTRAVENOUS | Status: DC | PRN
  Administered 2023-05-07: 100 ug/kg/min via INTRAVENOUS

## 2023-05-07 MED ORDER — OXYCODONE HCL 5 MG PO TABS: 5.0000 mg | ORAL_TABLET | Freq: Four times a day (QID) | ORAL | 0 refills | Status: AC | PRN

## 2023-05-07 MED ORDER — CEFAZOLIN SODIUM-DEXTROSE 2-4 GM/100ML-% IV SOLN
2.0000 g | INTRAVENOUS | Status: AC
  Administered 2023-05-07: 2 g via INTRAVENOUS

## 2023-05-07 MED ORDER — ACETAMINOPHEN 500 MG PO TABS
ORAL_TABLET | ORAL | Status: AC
  Filled 2023-05-07: qty 2

## 2023-05-07 SURGICAL SUPPLY — 41 items
APL PRP STRL LF DISP 70% ISPRP (MISCELLANEOUS) ×1
BLADE SURG 15 STRL LF DISP TIS (BLADE) ×1 IMPLANT
BLADE SURG 15 STRL SS (BLADE) ×2
BNDG CMPR 5X3 KNIT ELC UNQ LF (GAUZE/BANDAGES/DRESSINGS) ×1
BNDG CMPR 9X4 STRL LF SNTH (GAUZE/BANDAGES/DRESSINGS) ×1
BNDG ELASTIC 3INX 5YD STR LF (GAUZE/BANDAGES/DRESSINGS) ×1 IMPLANT
BNDG ESMARK 4X9 LF (GAUZE/BANDAGES/DRESSINGS) ×1 IMPLANT
BNDG GAUZE DERMACEA FLUFF 4 (GAUZE/BANDAGES/DRESSINGS) ×1 IMPLANT
BNDG GZE DERMACEA 4 6PLY (GAUZE/BANDAGES/DRESSINGS) ×1
BNDG PLASTER X FAST 3X3 WHT LF (CAST SUPPLIES) IMPLANT
BNDG PLSTR 9X3 FST ST WHT (CAST SUPPLIES)
CHLORAPREP W/TINT 26 (MISCELLANEOUS) ×1 IMPLANT
CORD BIPOLAR FORCEPS 12FT (ELECTRODE) ×1 IMPLANT
COVER BACK TABLE 60X90IN (DRAPES) ×1 IMPLANT
CUFF TOURN SGL QUICK 18X4 (TOURNIQUET CUFF) IMPLANT
CUFF TOURN SGL QUICK 24 (TOURNIQUET CUFF)
CUFF TRNQT CYL 24X4X16.5-23 (TOURNIQUET CUFF) IMPLANT
DRAPE EXTREMITY T 121X128X90 (DISPOSABLE) ×1 IMPLANT
DRAPE SURG 17X23 STRL (DRAPES) ×1 IMPLANT
GAUZE XEROFORM 1X8 LF (GAUZE/BANDAGES/DRESSINGS) ×1 IMPLANT
GLOVE BIO SURGEON STRL SZ7 (GLOVE) ×1 IMPLANT
GLOVE BIOGEL PI IND STRL 7.0 (GLOVE) ×1 IMPLANT
GOWN STRL REUS W/ TWL LRG LVL3 (GOWN DISPOSABLE) ×2 IMPLANT
GOWN STRL REUS W/TWL LRG LVL3 (GOWN DISPOSABLE) ×1
NDL HYPO 25X1 1.5 SAFETY (NEEDLE) IMPLANT
NEEDLE HYPO 25X1 1.5 SAFETY (NEEDLE) ×1 IMPLANT
NS IRRIG 1000ML POUR BTL (IV SOLUTION) ×1 IMPLANT
PACK BASIN DAY SURGERY FS (CUSTOM PROCEDURE TRAY) ×1 IMPLANT
PAD CAST 3X4 CTTN HI CHSV (CAST SUPPLIES) IMPLANT
PADDING CAST COTTON 3X4 STRL (CAST SUPPLIES)
SHEET MEDIUM DRAPE 40X70 STRL (DRAPES) ×1 IMPLANT
SLEEVE SCD COMPRESS KNEE MED (STOCKING) IMPLANT
SLING ARM FOAM STRAP MED (SOFTGOODS) IMPLANT
SPLINT FIBERGLASS 4X30 (CAST SUPPLIES) IMPLANT
SUT ETHILON 4 0 PS 2 18 (SUTURE) ×1 IMPLANT
SUT MNCRL AB 3-0 PS2 18 (SUTURE) ×1 IMPLANT
SUT VIC AB 4-0 PS2 18 (SUTURE) IMPLANT
SYR BULB EAR ULCER 3OZ GRN STR (SYRINGE) ×1 IMPLANT
SYR CONTROL 10ML LL (SYRINGE) IMPLANT
TOWEL GREEN STERILE FF (TOWEL DISPOSABLE) ×2 IMPLANT
UNDERPAD 30X36 HEAVY ABSORB (UNDERPADS AND DIAPERS) ×1 IMPLANT

## 2023-05-07 NOTE — Progress Notes (Signed)
Assisted Dr. Edmond Fitzgerald with right, supraclavicular, ultrasound guided block. Side rails up, monitors on throughout procedure. See vital signs in flow sheet. Tolerated Procedure well. 

## 2023-05-07 NOTE — Anesthesia Procedure Notes (Signed)
Procedure Name: MAC Date/Time: 05/07/2023 8:40 AM  Performed by: Demetrio Lapping, CRNAPre-anesthesia Checklist: Patient identified, Emergency Drugs available, Suction available, Patient being monitored and Timeout performed Patient Re-evaluated:Patient Re-evaluated prior to induction Oxygen Delivery Method: Simple face mask Placement Confirmation: positive ETCO2 Dental Injury: Teeth and Oropharynx as per pre-operative assessment

## 2023-05-07 NOTE — Discharge Instructions (Addendum)
  Charles Benfield, M.D. Hand Surgery  POST-OPERATIVE DISCHARGE INSTRUCTIONS   PRESCRIPTIONS: You may have been given a prescription to be taken as directed for post-operative pain control.  You may also take over the counter ibuprofen/aleve and tylenol for pain. Take this as directed on the packaging. Do not exceed 3000 mg tylenol/acetaminophen in 24 hours.  Ibuprofen 600-800 mg (3-4) tablets by mouth every 6 hours as needed for pain.  OR Aleve 2 tablets by mouth every 12 hours (twice daily) as needed for pain.  AND/OR Tylenol 1000 mg (2 tablets) every 8 hours as needed for pain.  Please use your pain medication carefully, as refills are limited and you may not be provided with one.  As stated above, please use over the counter pain medicine - it will also be helpful with decreasing your swelling.    ANESTHESIA: After your surgery, post-surgical discomfort or pain is likely. This discomfort can last several days to a few weeks. At certain times of the day your discomfort may be more intense.   Did you receive a nerve block?  A nerve block can provide pain relief for one hour to two days after your surgery. As long as the nerve block is working, you will experience little or no sensation in the area the surgeon operated on.  As the nerve block wears off, you will begin to experience pain or discomfort. It is very important that you begin taking your prescribed pain medication before the nerve block fully wears off. Treating your pain at the first sign of the block wearing off will ensure your pain is better controlled and more tolerable when full-sensation returns. Do not wait until the pain is intolerable, as the medicine will be less effective. It is better to treat pain in advance than to try and catch up.   General Anesthesia:  If you did not receive a nerve block during your surgery, you will need to start taking your pain medication shortly after your surgery and should continue  to do so as prescribed by your surgeon.     ICE AND ELEVATION: You may use ice for the first 48-72 hours, but it is not critical.   Motion of your fingers is very important to decrease the swelling.  Elevation, as much as possible for the next 48 hours, is critical for decreasing swelling as well as for pain relief. Elevation means when you are seated or lying down, you hand should be at or above your heart. When walking, the hand needs to be at or above the level of your elbow.  If the bandage gets too tight, it may need to be loosened. Please contact our office and we will instruct you in how to do this.    SURGICAL BANDAGES:  Keep your dressing and/or splint clean and dry at all times.  Do not remove until you are seen again in the office.  If careful, you may place a plastic bag over your bandage and tape the end to shower, but be careful, do not get your bandages wet.     HAND THERAPY:  You may not need any. If you do, we will begin this at your follow up visit in the clinic.    ACTIVITY AND WORK: You are encouraged to move any fingers which are not in the bandage.  Light use of the fingers is allowed to assist the other hand with daily hygiene and eating, but strong gripping or lifting is often uncomfortable and   should be avoided.  You might miss a variable period of time from work and hopefully this issue has been discussed prior to surgery. You may not do any heavy work with your affected hand for about 2 weeks.    [facility] Second Floor, [address] [place], [place] [address] [phone]    No Tylenol before 1:30pm if needed.  Post Anesthesia Home Care Instructions  Activity: Get plenty of rest for the remainder of the day. A responsible individual must stay with you for 24 hours following the procedure.  For the next 24 hours, DO NOT: -Drive a car -Advertising copywriter -Drink alcoholic beverages -Take any medication unless instructed by your  physician -Make any legal decisions or sign important papers.  Meals: Start with liquid foods such as gelatin or soup. Progress to regular foods as tolerated. Avoid greasy, spicy, heavy foods. If nausea and/or vomiting occur, drink only clear liquids until the nausea and/or vomiting subsides. Call your physician if vomiting continues.  Special Instructions/Symptoms: Your throat may feel dry or sore from the anesthesia or the breathing tube placed in your throat during surgery. If this causes discomfort, gargle with warm salt water. The discomfort should disappear within 24 hours.     Regional Anesthesia Blocks  1. Numbness or the inability to move the "blocked" extremity may last from 3-48 hours after placement. The length of time depends on the medication injected and your individual response to the medication. If the numbness is not going away after 48 hours, call your surgeon.  2. The extremity that is blocked will need to be protected until the numbness is gone and the  Strength has returned. Because you cannot feel it, you will need to take extra care to avoid injury. Because it may be weak, you may have difficulty moving it or using it. You may not know what position it is in without looking at it while the block is in effect.  3. For blocks in the legs and feet, returning to weight bearing and walking needs to be done carefully. You will need to wait until the numbness is entirely gone and the strength has returned. You should be able to move your leg and foot normally before you try and bear weight or walk. You will need someone to be with you when you first try to ensure you do not fall and possibly risk injury.  4. Bruising and tenderness at the needle site are common side effects and will resolve in a few days.  5. Persistent numbness or new problems with movement should be communicated to the surgeon or the [facility] [phone] [facility]  [phone]).

## 2023-05-07 NOTE — H&P (Signed)
HAND SURGERY   HPI: Patient is a 57 y.o. 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  These findings were confirmed on prior MRI.  Her symptoms have failed conservative management with bracing, activity modification, oral NSAID, oral corticosteroid taper, and multiple corticosteroid injections.  Patient denies any changes to their medical history or new systemic symptoms today.    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 Earma Reading 2018   Hx of adenomatous polyp of colon 05/03/2017   Hypertension    resolved since about 02/2023 per patient- no BP meds   Moderate major depression, single episode (HCC) 07/15/2016   Symptomatic cholelithiasis 04/08/2014   Past Surgical History:  Procedure Laterality Date   CHOLECYSTECTOMY N/A 04/29/2014   Procedure: LAPAROSCOPIC CHOLECYSTECTOMY WITH INTRAOPERATIVE CHOLANGIOGRAM;  Surgeon: Liz Malady, MD;  Location: [facility] OR;  Service: General;  Laterality: N/A;   COLONOSCOPY W/ POLYPECTOMY  04/2017   LAPAROSCOPIC LYSIS OF ADHESIONS N/A 04/29/2014   Procedure: LAPAROSCOPIC LYSIS OF ADHESIONS;  Surgeon: Liz Malady, MD;  Location: [facility] OR;  Service: General;  Laterality: N/A;   Social History   Socioeconomic History   Marital status: Married    Spouse name: Not on file   Number of children: 2   Years of education: Not on file   Highest education level: Bachelor's degree (e.g., BA, AB, BS)  Occupational History   Not on file  Tobacco Use   Smoking status: Former    Types: Cigarettes    Quit date: 04/08/2013    Years since quitting: 10.0   Smokeless tobacco: Never  Vaping Use   Vaping Use: Never used  Substance and Sexual Activity   Alcohol use: Not Currently    Comment: very rarely/ once or twice a year   Drug use: Yes     Types: Marijuana    Comment: gummies, last time 2 days ago   Sexual activity: Not Currently  Other Topics Concern   Not on file  Social History Narrative   Not on file   Social Determinants of Health   Financial Resource Strain: High Risk (03/14/2023)   Overall Financial Resource Strain (CARDIA)    Difficulty of Paying Living Expenses: Very hard  Food Insecurity: Food Insecurity Present (03/16/2023)   Hunger Vital Sign    Worried About Running Out of Food in the Last Year: Sometimes true    Ran Out of Food in the Last Year: Sometimes true  Transportation Needs: No Transportation Needs (03/16/2023)   PRAPARE - Administrator, Civil Service (Medical): No    Lack of Transportation (Non-Medical): No  Physical Activity: Insufficiently Active (03/14/2023)   Exercise Vital Sign    Days of Exercise per Week: 3 days    Minutes of Exercise per Session: 30 min  Stress: No Stress Concern Present (03/14/2023)   [employer] of Occupational Health - Occupational Stress Questionnaire    Feeling of Stress : Only a little  Social Connections: Unknown (03/14/2023)   Social Connection and Isolation Panel [NHANES]    Frequency of Communication with Friends and Family: Once a week    Frequency of Social Gatherings with Friends and Family: Patient declined    Attends Religious Services: Patient declined    Active Member of Clubs or Organizations: Yes    Attends  Engineer, structural: More than 4 times per year    Marital Status: Married   Family History  Problem Relation Age of Onset   Colon cancer Neg Hx    Esophageal cancer Neg Hx    Liver cancer Neg Hx    Pancreatic cancer Neg Hx    Stomach cancer Neg Hx    Rectal cancer Neg Hx    - negative except otherwise stated in the family history section Allergies  Allergen Reactions   Ace Inhibitors Cough    Angioedema   Maxzide [Hydrochlorothiazide W-Triamterene] Other (See Comments)    hypokalemia   Prior to Admission  medications   Medication Sig Start Date End Date Taking? Authorizing Provider  cholecalciferol (VITAMIN D3) 25 MCG (1000 UNIT) tablet Take 1,000 Units by mouth daily.   Yes [provider]  diclofenac (VOLTAREN) 75 MG EC tablet TAKE 1 TABLET BY MOUTH TWICE A DAY 04/25/23  Yes Willow Ora, MD  levocetirizine (XYZAL) 5 MG tablet Take 5 mg by mouth every evening. 02/26/23  Yes [provider]  azelastine (OPTIVAR) 0.05 % ophthalmic solution Place 1 drop into both eyes 2 (two) times daily as needed (allergies). 02/26/23   [provider]  EPINEPHrine 0.3 mg/0.3 mL IJ SOAJ injection Inject 0.3 mg into the muscle as needed for anaphylaxis. 02/26/23   [provider]  ipratropium (ATROVENT) 0.06 % nasal spray Place 1 spray into both nostrils daily as needed (allergies). 02/26/23   [provider]   No results found. - Positive ROS: All other systems have been reviewed and were otherwise negative with the exception of those mentioned in the HPI and as above.  Physical Exam: General: No acute distress, resting comfortably Cardiovascular: BUE warm and well perfused, normal rate Respiratory: Normal WOB on RA Skin: Warm and dry Neurologic: Sensation intact distally Psychiatric: Patient is at baseline mood and affect  Right Upper Extremity  Moderate swelling of dorsal and ulnar wrist/hand.  Tender to palpation over fourth dorsal compartment tendons at level of wrist and hand.  Tender to palpation at ulnar wrist over focal swelling.  Pain w/ finger extension with wrist extended.  No pain w/ PROM of wrist.  SILT m/u/r distribution.  Hand warm and well perfused w/ BCR.   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  These findings were confirmed on prior MRI and have failed conservative management with activity modification, bracing, oral NSAID, oral corticosteroid taper,  and multiple corticosteroid injections.   Plan: OR today for right extensor tenosynovectomy and excision of ulnar ganglion cyst. We again reviewed the risks of surgery which include bleeding, infection, damage to neurovascular structures, persistent symptoms, persistent pain, finger stiffness, need for additional surgery.   All questions were answered.   Marlyne Beards, M.D. [facility] 7:53 AM

## 2023-05-07 NOTE — Anesthesia Procedure Notes (Signed)
Anesthesia Regional Block: Supraclavicular block   Pre-Anesthetic Checklist: , timeout performed,  Correct Patient, Correct Site, Correct Laterality,  Correct Procedure, Correct Position, site marked,  Risks and benefits discussed,  Pre-op evaluation,  At surgeon's request  Laterality: Right  Prep: Maximum Sterile Barrier Precautions used, chloraprep       Needles:  Injection technique: Single-shot  Needle Type: Echogenic Stimulator Needle     Needle Length: 9cm  Needle Gauge: 21     Additional Needles:   Procedures:,,,, ultrasound used (permanent image in chart),,    Narrative:  Start time: 05/07/2023 8:00 AM End time: 05/07/2023 8:10 AM Injection made incrementally with aspirations every 5 mL.  Performed by: Personally  Anesthesiologist: Gaynelle Adu, MD  Additional Notes:

## 2023-05-07 NOTE — Op Note (Signed)
Date of Surgery: 05/07/2023  INDICATIONS: Patient is a 57 y.o.-year-old female with right dorsal and ulnar hand and wrist pain that seems most consistent with fourth dorsal compartment extensor tenosynovitis.  An MRI was obtained previously which confirmed fourth compartment extensor tenosynovitis and demonstrated a ganglion cyst at the ulnar aspect of the wrist arising from the pisotriquetral joint.  This corresponds to the area of her ulnar-sided wrist pain.  She has failed extensive nonsurgical management with activity modification, bracing, oral anti-inflammatory medications, oral corticosteroid taper, and multiple corticosteroid injections.  Her previous injection into the fourth dorsal compartment provided complete symptom relief, however, her symptoms returned.  Risks, benefits, and alternatives to surgery were again discussed with the patient in the preoperative area. The patient wishes to proceed with surgery.  Informed consent was signed after our discussion.   PREOPERATIVE DIAGNOSIS:  Right fourth dorsal compartment extensor tenosynovitis Right ulnar ganglion cyst  POSTOPERATIVE DIAGNOSIS: Same.  PROCEDURE:  Right fourth dorsal compartment extensor tenosynovectomy (16109) Right ulnar ganglion cyst excision (60454)   SURGEON: Waylan Rocher, M.D.  ASSIST: None  ANESTHESIA:  Regional + MAC  IV FLUIDS AND URINE: See anesthesia.  ESTIMATED BLOOD LOSS: <10 mL.  IMPLANTS: * No implants in log *   DRAINS: None  COMPLICATIONS: None  SPECIMENS: Extensor tenosynovial tissue and ulnar wrist mass for permanent pathology  DESCRIPTION OF PROCEDURE: The patient was met in the preoperative holding area where the surgical site was marked and the consent form was signed.  A regional block was performed by anesthesia.  The patient was then brought back to the operating room and remained on the stretcher.  A hand table was placed adjacent to the operative extremity and locked into place.   A tourniquet was placed on the right upper arm.  The right upper extremity was then prepped and draped in the usual and sterile fashion. A formal timeout was performed to confirm that this was the correct patient, surgical side, surgical site, and surgical procedure.  All were present and in agreement.   Following formal timeout, the limb was gently exsanguinated with an Esmarch bandage and the tourniquet inflated to 250 mmHg.  I began at the dorsal aspect of the wrist.  A longitudinal incision was made at the central portion of the wrist along the fourth dorsal compartment from the proximal aspect of the hand across the wrist.  The skin was incised.  Blunt dissection was used to develop full-thickness skin flaps.  Small crossing vessels were coagulated with bipolar electrocautery.  The fourth dorsal compartment tendons were encountered at the dorsal aspect of the hand.  There was extensive tenosynovial tissue surrounding the tendons at this level.  Using a tenotomy scissor, sharp excisional debridement was performed of all fourth dorsal compartment tendons at the hand level.  The extensor retinaculum over the fourth dorsal compartment was identified.  The distal half or so of the retinaculum was incised to allow access to the tendons but with enough retinaculum remaining to prevent bowstringing.  A complete tenosynovectomy was performed of all tendons within the extensor retinaculum using a tenotomy scissor.  A portion of this inflammatory appearing tenosynovial tissue was sent for permanent pathology.  The tendons had some mild fraying but were overall intact.  The wound was then thoroughly irrigated and loosely reapproximated with a few 4-0 nylon sutures.  I then turned my attention to the ulnar aspect of the wrist.  An approximately 3 cm incision was made along the ulnar border of  the wrist around the level of the ulnar styloid.  The skin was incised.  Blunt dissection was used to prevent injury to the  dorsal cutaneous branch of the ulnar nerve.  Further blunt dissection was used to identify a mass at the ulnar aspect of the wrist.  Blunt dissection was used to dissect circumferentially around the mass.  The tail of the cyst appeared to be heading towards the capsule around the pisotriquetral joint.  The cyst was excised en bloc.  This was passed off the back table as a permanent pathology specimen.  Following complete cyst excision, this ulnar-sided wound was thoroughly irrigated with copious sterile saline.  The tourniquet was deflated.  Hemostasis was achieved with bipolar cautery and direct pressure over the wound.  The ulnar-sided wound was then closed in a horizontal mattress fashion using a 4-0 nylon suture.  I then returned to the dorsal wound.  Hemostasis was achieved with direct pressure over the wound.  The wound was then completely closed using a 4-0 nylon suture in horizontal fashion.  The hand was warm and well-perfused with the tourniquet deflated.  Wounds were then dressed with Xeroform, folded Kerlix, cast padding, and a well-padded volar wrist splint was applied.  The patient was reversed from sedation.  The drapes were taken down.  All counts were correct x 2 at the end of the procedure.  The patient was then taken to the PACU in stable condition.   POSTOPERATIVE PLAN: She will be discharged to home with appropriate pain medication and discharge instructions.  I will see her back in my office in 10 to 14 days for her first postop visit.  Waylan Rocher, MD 9:52 AM

## 2023-05-07 NOTE — Interval H&P Note (Signed)
History and Physical Interval Note:  05/07/2023 8:32 AM  Deborah Stevens  has presented today for surgery, with the diagnosis of Extensor tenosynovitis of right wrist, right ulnar carpal ganglion cyst.  The various methods of treatment have been discussed with the patient and family. After consideration of risks, benefits and other options for treatment, the patient has consented to  Procedure(s): EXCISION OF RIGHT ULNAR CARPAL  GANGLION CYST, EXCISIONAL DEBRIDEMENT OF WRIST EXTENSOR TENDONS (Right) as a surgical intervention.  The patient's history has been reviewed, patient examined, no change in status, stable for surgery.  I have reviewed the patient's chart and labs.  Questions were answered to the patient's satisfaction.     [name] Wilmary Levit

## 2023-05-07 NOTE — Anesthesia Postprocedure Evaluation (Signed)
Anesthesia Post Note  Patient: Deborah Stevens  Procedure(s) Performed: EXCISION OF RIGHT ULNAR CARPAL  GANGLION CYST, EXCISIONAL DEBRIDEMENT OF WRIST EXTENSOR TENDONS (Right: Wrist)     Patient location during evaluation: PACU Anesthesia Type: Regional and MAC Level of consciousness: awake and alert Pain management: pain level controlled Vital Signs Assessment: post-procedure vital signs reviewed and stable Respiratory status: spontaneous breathing, nonlabored ventilation and respiratory function stable Cardiovascular status: stable and blood pressure returned to baseline Postop Assessment: no apparent nausea or vomiting Anesthetic complications: no  No notable events documented.  Last Vitals:  Vitals:   05/07/23 1015 05/07/23 1036  BP: 122/67 111/75  Pulse: 65 64  Resp: 16 18  Temp:  (!) 36.3 C  SpO2: 100% 99%    Last Pain:  Vitals:   05/07/23 1036  TempSrc:   PainSc: 0-No pain                 Aquila Menzie,W. EDMOND

## 2023-05-07 NOTE — Transfer of Care (Signed)
Immediate Anesthesia Transfer of Care Note  Patient: Candance Renella Cunas  Procedure(s) Performed: EXCISION OF RIGHT ULNAR CARPAL  GANGLION CYST, EXCISIONAL DEBRIDEMENT OF WRIST EXTENSOR TENDONS (Right: Wrist)  Patient Location: PACU  Anesthesia Type:Patient Spontanous Breathing and Patient connected to face mask oxygen  Level of Consciousness: awake and alert   Airway & Oxygen Therapy: Patient Spontanous Breathing and Patient connected to face mask oxygen  Post-op Assessment: Report given to RN and Post -op Vital signs reviewed and stable  Post vital signs: Reviewed and stable  Last Vitals:  Vitals Value Taken Time  BP 122/67 05/07/23 1015  Temp 36.3 C 05/07/23 0955  Pulse 66 05/07/23 1023  Resp 15 05/07/23 1023  SpO2 100 % 05/07/23 1023  Vitals shown include unvalidated device data.  Last Pain:  Vitals:   05/07/23 1015  TempSrc:   PainSc: 0-No pain      Patients Stated Pain Goal: 7 (05/07/23 0715)  Complications: No notable events documented.

## 2023-05-08 ENCOUNTER — Encounter (HOSPITAL_BASED_OUTPATIENT_CLINIC_OR_DEPARTMENT_OTHER): Payer: Self-pay | Admitting: Orthopedic Surgery

## 2023-05-08 LAB — SURGICAL PATHOLOGY

## 2023-05-09 DIAGNOSIS — J3089 Other allergic rhinitis: Secondary | ICD-10-CM | POA: Diagnosis not present

## 2023-05-09 DIAGNOSIS — J301 Allergic rhinitis due to pollen: Secondary | ICD-10-CM | POA: Diagnosis not present

## 2023-05-09 DIAGNOSIS — J3081 Allergic rhinitis due to animal (cat) (dog) hair and dander: Secondary | ICD-10-CM | POA: Diagnosis not present

## 2023-05-12 DIAGNOSIS — J301 Allergic rhinitis due to pollen: Secondary | ICD-10-CM | POA: Diagnosis not present

## 2023-05-12 DIAGNOSIS — J3081 Allergic rhinitis due to animal (cat) (dog) hair and dander: Secondary | ICD-10-CM | POA: Diagnosis not present

## 2023-05-12 DIAGNOSIS — J3089 Other allergic rhinitis: Secondary | ICD-10-CM | POA: Diagnosis not present

## 2023-05-14 DIAGNOSIS — J3089 Other allergic rhinitis: Secondary | ICD-10-CM | POA: Diagnosis not present

## 2023-05-14 DIAGNOSIS — J3081 Allergic rhinitis due to animal (cat) (dog) hair and dander: Secondary | ICD-10-CM | POA: Diagnosis not present

## 2023-05-14 DIAGNOSIS — J301 Allergic rhinitis due to pollen: Secondary | ICD-10-CM | POA: Diagnosis not present

## 2023-05-16 DIAGNOSIS — J3089 Other allergic rhinitis: Secondary | ICD-10-CM | POA: Diagnosis not present

## 2023-05-16 DIAGNOSIS — J3081 Allergic rhinitis due to animal (cat) (dog) hair and dander: Secondary | ICD-10-CM | POA: Diagnosis not present

## 2023-05-16 DIAGNOSIS — J301 Allergic rhinitis due to pollen: Secondary | ICD-10-CM | POA: Diagnosis not present

## 2023-05-20 DIAGNOSIS — J3089 Other allergic rhinitis: Secondary | ICD-10-CM | POA: Diagnosis not present

## 2023-05-20 DIAGNOSIS — J301 Allergic rhinitis due to pollen: Secondary | ICD-10-CM | POA: Diagnosis not present

## 2023-05-20 DIAGNOSIS — J3081 Allergic rhinitis due to animal (cat) (dog) hair and dander: Secondary | ICD-10-CM | POA: Diagnosis not present

## 2023-05-22 ENCOUNTER — Other Ambulatory Visit: Payer: Self-pay | Admitting: Family Medicine

## 2023-05-22 DIAGNOSIS — J3089 Other allergic rhinitis: Secondary | ICD-10-CM | POA: Diagnosis not present

## 2023-05-22 DIAGNOSIS — J3081 Allergic rhinitis due to animal (cat) (dog) hair and dander: Secondary | ICD-10-CM | POA: Diagnosis not present

## 2023-05-22 DIAGNOSIS — J301 Allergic rhinitis due to pollen: Secondary | ICD-10-CM | POA: Diagnosis not present

## 2023-05-27 DIAGNOSIS — J3081 Allergic rhinitis due to animal (cat) (dog) hair and dander: Secondary | ICD-10-CM | POA: Diagnosis not present

## 2023-05-27 DIAGNOSIS — J301 Allergic rhinitis due to pollen: Secondary | ICD-10-CM | POA: Diagnosis not present

## 2023-05-27 DIAGNOSIS — J3089 Other allergic rhinitis: Secondary | ICD-10-CM | POA: Diagnosis not present

## 2023-05-30 DIAGNOSIS — M79641 Pain in right hand: Secondary | ICD-10-CM | POA: Diagnosis not present

## 2023-05-30 DIAGNOSIS — J301 Allergic rhinitis due to pollen: Secondary | ICD-10-CM | POA: Diagnosis not present

## 2023-05-30 DIAGNOSIS — J3081 Allergic rhinitis due to animal (cat) (dog) hair and dander: Secondary | ICD-10-CM | POA: Diagnosis not present

## 2023-05-30 DIAGNOSIS — J3089 Other allergic rhinitis: Secondary | ICD-10-CM | POA: Diagnosis not present

## 2023-06-02 DIAGNOSIS — M79641 Pain in right hand: Secondary | ICD-10-CM | POA: Diagnosis not present

## 2023-06-06 DIAGNOSIS — M79641 Pain in right hand: Secondary | ICD-10-CM | POA: Diagnosis not present

## 2023-06-10 DIAGNOSIS — M79641 Pain in right hand: Secondary | ICD-10-CM | POA: Diagnosis not present

## 2023-06-13 DIAGNOSIS — M79641 Pain in right hand: Secondary | ICD-10-CM | POA: Diagnosis not present

## 2023-06-16 DIAGNOSIS — M79641 Pain in right hand: Secondary | ICD-10-CM | POA: Diagnosis not present

## 2023-06-17 DIAGNOSIS — J301 Allergic rhinitis due to pollen: Secondary | ICD-10-CM | POA: Diagnosis not present

## 2023-06-17 DIAGNOSIS — J3089 Other allergic rhinitis: Secondary | ICD-10-CM | POA: Diagnosis not present

## 2023-06-17 DIAGNOSIS — J3081 Allergic rhinitis due to animal (cat) (dog) hair and dander: Secondary | ICD-10-CM | POA: Diagnosis not present

## 2023-06-20 DIAGNOSIS — M79641 Pain in right hand: Secondary | ICD-10-CM | POA: Diagnosis not present

## 2023-06-23 DIAGNOSIS — J3081 Allergic rhinitis due to animal (cat) (dog) hair and dander: Secondary | ICD-10-CM | POA: Diagnosis not present

## 2023-06-23 DIAGNOSIS — J3089 Other allergic rhinitis: Secondary | ICD-10-CM | POA: Diagnosis not present

## 2023-06-23 DIAGNOSIS — J301 Allergic rhinitis due to pollen: Secondary | ICD-10-CM | POA: Diagnosis not present

## 2023-06-25 DIAGNOSIS — M79641 Pain in right hand: Secondary | ICD-10-CM | POA: Diagnosis not present

## 2023-06-27 DIAGNOSIS — M79641 Pain in right hand: Secondary | ICD-10-CM | POA: Diagnosis not present

## 2023-06-30 DIAGNOSIS — M79641 Pain in right hand: Secondary | ICD-10-CM | POA: Diagnosis not present

## 2023-07-01 DIAGNOSIS — J3081 Allergic rhinitis due to animal (cat) (dog) hair and dander: Secondary | ICD-10-CM | POA: Diagnosis not present

## 2023-07-01 DIAGNOSIS — J3089 Other allergic rhinitis: Secondary | ICD-10-CM | POA: Diagnosis not present

## 2023-07-01 DIAGNOSIS — J301 Allergic rhinitis due to pollen: Secondary | ICD-10-CM | POA: Diagnosis not present

## 2023-07-04 DIAGNOSIS — M79641 Pain in right hand: Secondary | ICD-10-CM | POA: Diagnosis not present

## 2023-07-07 DIAGNOSIS — J3081 Allergic rhinitis due to animal (cat) (dog) hair and dander: Secondary | ICD-10-CM | POA: Diagnosis not present

## 2023-07-07 DIAGNOSIS — J301 Allergic rhinitis due to pollen: Secondary | ICD-10-CM | POA: Diagnosis not present

## 2023-07-07 DIAGNOSIS — M79641 Pain in right hand: Secondary | ICD-10-CM | POA: Diagnosis not present

## 2023-07-07 DIAGNOSIS — J3089 Other allergic rhinitis: Secondary | ICD-10-CM | POA: Diagnosis not present

## 2023-07-11 DIAGNOSIS — J3089 Other allergic rhinitis: Secondary | ICD-10-CM | POA: Diagnosis not present

## 2023-07-11 DIAGNOSIS — J3081 Allergic rhinitis due to animal (cat) (dog) hair and dander: Secondary | ICD-10-CM | POA: Diagnosis not present

## 2023-07-11 DIAGNOSIS — J301 Allergic rhinitis due to pollen: Secondary | ICD-10-CM | POA: Diagnosis not present

## 2023-07-11 DIAGNOSIS — M79641 Pain in right hand: Secondary | ICD-10-CM | POA: Diagnosis not present

## 2023-07-14 DIAGNOSIS — M79641 Pain in right hand: Secondary | ICD-10-CM | POA: Diagnosis not present

## 2023-07-16 ENCOUNTER — Encounter: Payer: Self-pay | Admitting: Family Medicine

## 2023-07-22 DIAGNOSIS — J3081 Allergic rhinitis due to animal (cat) (dog) hair and dander: Secondary | ICD-10-CM | POA: Diagnosis not present

## 2023-07-22 DIAGNOSIS — J3089 Other allergic rhinitis: Secondary | ICD-10-CM | POA: Diagnosis not present

## 2023-07-22 DIAGNOSIS — J301 Allergic rhinitis due to pollen: Secondary | ICD-10-CM | POA: Diagnosis not present

## 2023-07-23 DIAGNOSIS — R052 Subacute cough: Secondary | ICD-10-CM | POA: Diagnosis not present

## 2023-07-23 DIAGNOSIS — M79641 Pain in right hand: Secondary | ICD-10-CM | POA: Diagnosis not present

## 2023-07-23 DIAGNOSIS — H1045 Other chronic allergic conjunctivitis: Secondary | ICD-10-CM | POA: Diagnosis not present

## 2023-07-23 DIAGNOSIS — J3089 Other allergic rhinitis: Secondary | ICD-10-CM | POA: Diagnosis not present

## 2023-07-23 DIAGNOSIS — R21 Rash and other nonspecific skin eruption: Secondary | ICD-10-CM | POA: Diagnosis not present

## 2023-07-29 ENCOUNTER — Encounter: Payer: Self-pay | Admitting: Family Medicine

## 2023-07-29 DIAGNOSIS — J3089 Other allergic rhinitis: Secondary | ICD-10-CM | POA: Diagnosis not present

## 2023-07-29 DIAGNOSIS — J3081 Allergic rhinitis due to animal (cat) (dog) hair and dander: Secondary | ICD-10-CM | POA: Diagnosis not present

## 2023-07-29 DIAGNOSIS — J301 Allergic rhinitis due to pollen: Secondary | ICD-10-CM | POA: Diagnosis not present

## 2023-08-01 DIAGNOSIS — M79641 Pain in right hand: Secondary | ICD-10-CM | POA: Diagnosis not present

## 2023-08-04 DIAGNOSIS — M79641 Pain in right hand: Secondary | ICD-10-CM | POA: Diagnosis not present

## 2023-08-05 DIAGNOSIS — J3081 Allergic rhinitis due to animal (cat) (dog) hair and dander: Secondary | ICD-10-CM | POA: Diagnosis not present

## 2023-08-05 DIAGNOSIS — J301 Allergic rhinitis due to pollen: Secondary | ICD-10-CM | POA: Diagnosis not present

## 2023-08-05 DIAGNOSIS — J3089 Other allergic rhinitis: Secondary | ICD-10-CM | POA: Diagnosis not present

## 2023-08-07 DIAGNOSIS — F4322 Adjustment disorder with anxiety: Secondary | ICD-10-CM | POA: Diagnosis not present

## 2023-08-08 DIAGNOSIS — M79641 Pain in right hand: Secondary | ICD-10-CM | POA: Diagnosis not present

## 2023-08-11 DIAGNOSIS — M79641 Pain in right hand: Secondary | ICD-10-CM | POA: Diagnosis not present

## 2023-08-12 DIAGNOSIS — J3081 Allergic rhinitis due to animal (cat) (dog) hair and dander: Secondary | ICD-10-CM | POA: Diagnosis not present

## 2023-08-12 DIAGNOSIS — F4322 Adjustment disorder with anxiety: Secondary | ICD-10-CM | POA: Diagnosis not present

## 2023-08-12 DIAGNOSIS — J3089 Other allergic rhinitis: Secondary | ICD-10-CM | POA: Diagnosis not present

## 2023-08-12 DIAGNOSIS — J301 Allergic rhinitis due to pollen: Secondary | ICD-10-CM | POA: Diagnosis not present

## 2023-08-15 DIAGNOSIS — M79641 Pain in right hand: Secondary | ICD-10-CM | POA: Diagnosis not present

## 2023-08-19 DIAGNOSIS — J3081 Allergic rhinitis due to animal (cat) (dog) hair and dander: Secondary | ICD-10-CM | POA: Diagnosis not present

## 2023-08-19 DIAGNOSIS — J301 Allergic rhinitis due to pollen: Secondary | ICD-10-CM | POA: Diagnosis not present

## 2023-08-19 DIAGNOSIS — M65831 Other synovitis and tenosynovitis, right forearm: Secondary | ICD-10-CM | POA: Diagnosis not present

## 2023-08-19 DIAGNOSIS — J3089 Other allergic rhinitis: Secondary | ICD-10-CM | POA: Diagnosis not present

## 2023-08-19 DIAGNOSIS — M25631 Stiffness of right wrist, not elsewhere classified: Secondary | ICD-10-CM | POA: Diagnosis not present

## 2023-08-19 DIAGNOSIS — M67431 Ganglion, right wrist: Secondary | ICD-10-CM | POA: Diagnosis not present

## 2023-08-21 DIAGNOSIS — F4322 Adjustment disorder with anxiety: Secondary | ICD-10-CM | POA: Diagnosis not present

## 2023-08-22 DIAGNOSIS — M79641 Pain in right hand: Secondary | ICD-10-CM | POA: Diagnosis not present

## 2023-08-26 ENCOUNTER — Telehealth (INDEPENDENT_AMBULATORY_CARE_PROVIDER_SITE_OTHER): Payer: BC Managed Care – PPO | Admitting: Family Medicine

## 2023-08-26 DIAGNOSIS — U071 COVID-19: Secondary | ICD-10-CM | POA: Diagnosis not present

## 2023-08-26 MED ORDER — BENZONATATE 100 MG PO CAPS: ORAL_CAPSULE | ORAL | 0 refills | Status: DC

## 2023-08-26 MED ORDER — NIRMATRELVIR/RITONAVIR (PAXLOVID)TABLET: 3.0000 | ORAL_TABLET | Freq: Two times a day (BID) | ORAL | 0 refills | Status: AC

## 2023-08-26 NOTE — Progress Notes (Signed)
Virtual Visit via Video Note  I connected with Deborah Stevens  on 08/26/23 at  6:20 PM EDT by a video enabled telemedicine application and verified that I am speaking with the correct person using two identifiers.  Location patient: [place] Location provider:work or home office Persons participating in the virtual visit: patient, provider  I discussed the limitations and requested verbal permission for telemedicine visit. The patient expressed understanding and agreed to proceed.   HPI:  Acute telemedicine visit for Covid19: -Onset: last night, positive test today -Symptoms include: cough, runny nose, headache, sore throat, nausea -Denies: fever, CP, SOB, vomiting, diarrhea -Has tried: musinex -Pertinent past medical history: see below, GFR was 87 in April, several risk factors for covid, had covid once in 2022 -her only singulair, levcetirizine, voltaren -Pertinent medication allergies: Allergies  Allergen Reactions   Ace Inhibitors Cough    Angioedema   Maxzide [Hydrochlorothiazide W-Triamterene] Other (See Comments)    hypokalemia  -COVID-19 vaccine status:  no updated booster in the last year Immunization History  Administered Date(s) Administered   Influenza,inj,Quad PF,6+ Mos 08/16/2017, 10/21/2018, 09/11/2019, 10/01/2020, 08/20/2022   Influenza-Unspecified 09/27/2016, 08/16/2017, 10/14/2018, 11/08/2021   PFIZER(Purple Top)SARS-COV-2 Vaccination 02/24/2020, 03/25/2020, 10/01/2020, 04/28/2021   Tdap 12/23/2008, 07/16/2019   Zoster Recombinant(Shingrix) 07/16/2019, 12/03/2019     ROS: See pertinent positives and negatives per HPI.  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 Earma Reading 2018   Hx of adenomatous polyp of colon 05/03/2017   Hypertension    resolved since about 02/2023 per patient- no BP meds   Moderate major depression, single episode (HCC) 07/15/2016   Symptomatic cholelithiasis  04/08/2014    Past Surgical History:  Procedure Laterality Date   CHOLECYSTECTOMY N/A 04/29/2014   Procedure: LAPAROSCOPIC CHOLECYSTECTOMY WITH INTRAOPERATIVE CHOLANGIOGRAM;  Surgeon: Liz Malady, MD;  [facility];  Service: General;  Laterality: N/A;   COLONOSCOPY W/ POLYPECTOMY  04/2017   GANGLION CYST EXCISION Right 05/07/2023   Procedure: EXCISION OF RIGHT ULNAR CARPAL  GANGLION CYST, EXCISIONAL DEBRIDEMENT OF WRIST EXTENSOR TENDONS;  Surgeon: Marlyne Beards, MD;  Location: [facility];  Service: Orthopedics;  Laterality: Right;   LAPAROSCOPIC LYSIS OF ADHESIONS N/A 04/29/2014   Procedure: LAPAROSCOPIC LYSIS OF ADHESIONS;  Surgeon: Liz Malady, MD;  Location: [facility] OR;  Service: General;  Laterality: N/A;     Current Outpatient Medications:    benzonatate (TESSALON PERLES) 100 MG capsule, 1-2 capsules up to twice daily as needed for cough., Disp: 30 capsule, Rfl: 0   nirmatrelvir/ritonavir (PAXLOVID) 20 x 150 MG & 10 x 100MG  TABS, Take 3 tablets by mouth 2 (two) times daily for 5 days. (Take nirmatrelvir 150 mg two tablets twice daily for 5 days and ritonavir 100 mg one tablet twice daily for 5 days) Patient GFR is > 60, Disp: 30 tablet, Rfl: 0   azelastine (OPTIVAR) 0.05 % ophthalmic solution, Place 1 drop into both eyes 2 (two) times daily as needed (allergies)., Disp: , Rfl:    cholecalciferol (VITAMIN D3) 25 MCG (1000 UNIT) tablet, Take 1,000 Units by mouth daily., Disp: , Rfl:    diclofenac (VOLTAREN) 75 MG EC tablet, TAKE 1 TABLET BY MOUTH TWICE A DAY, Disp: 90 tablet, Rfl: 2   EPINEPHrine 0.3 mg/0.3 mL IJ SOAJ injection, Inject 0.3 mg into the muscle as needed for anaphylaxis., Disp: , Rfl:    ipratropium (ATROVENT) 0.06 % nasal spray, Place 1  spray into both nostrils daily as needed (allergies)., Disp: , Rfl:    levocetirizine (XYZAL) 5 MG tablet, Take 5 mg by mouth every evening., Disp: , Rfl:   EXAM:  VITALS per patient if applicable:  GENERAL: alert,  oriented, appears well and in no acute distress  HEENT: atraumatic, conjunttiva clear, no obvious abnormalities on inspection of external nose and ears  NECK: normal movements of the head and neck  LUNGS: on inspection no signs of respiratory distress, breathing rate appears normal, no obvious gross SOB, gasping or wheezing  CV: no obvious cyanosis  MS: moves all visible extremities without noticeable abnormality  PSYCH/NEURO: pleasant and cooperative, no obvious depression or anxiety, speech and thought processing grossly intact  ASSESSMENT AND PLAN:  Discussed the following assessment and plan:  COVID-19   Discussed treatment options, side effect and risk of drug interactions, ideal treatment window, potential complications, isolation and precautions for COVID-19.  Checked for/reviewed last GFR - listed in HPI if available.  After lengthy discussion, the patient opted for treatment with Paxlovid due to being higher risk for complications of covid or severe disease and other factors. Discussed side effects, alternatives, interactions if applicable. She agrees to hold supplements while taking. The patient did want a prescription for cough, Tessalon Rx sent.  Other symptomatic care measures summarized in patient instructions. Work/School slipped offered: provided in patient instructions   Advised to seek prompt virtual visit or in person care if worsening, new symptoms arise, or if is not improving with treatment as expected per our conversation of expected course. Discussed options for follow up care. Did let this patient know that I do telemedicine on Tuesdays and Thursdays for [facility] and those are the days I am logged into the system. Advised to schedule follow up visit with PCP, [facility] virtual visits or [facility] if any further questions or concerns to avoid delays in care.   I discussed the assessment and treatment plan with the patient. The patient was provided an opportunity to ask  questions and all were answered. The patient agreed with the plan and demonstrated an understanding of the instructions.     Terressa Koyanagi, DO

## 2023-08-26 NOTE — Patient Instructions (Addendum)
---------------------------------------------------------------------------------------------------------------------------    WORK SLIP:  Patient Deborah Stevens,  [birth year], was seen for a medical visit today, 08/26/23 . Please excuse from work for a COVID/flu like illness. Patient will likely be contagious for an average of 7-10 days from the onset of symptoms, PLUS 1 day of no fever and improved symptoms. Other viruses tend to be contagious for 5-10 days on average. Will defer to employer for a sooner return to work if patient has no fever and improved symptoms for 24 hours and can wear a good, well fitted mask at all times for at least 7-10 days and until asymptomatic.   Sincerely: E-signature: Dr. Kriste Basque, DO [facility] Ph: [phone]   ------------------------------------------------------------------------------------------------------------------------------  HOME CARE TIPS:  -I sent the medication(s) we discussed to your pharmacy: Meds ordered this encounter  Medications   nirmatrelvir/ritonavir (PAXLOVID) 20 x 150 MG & 10 x 100MG  TABS    Sig: Take 3 tablets by mouth 2 (two) times daily for 5 days. (Take nirmatrelvir 150 mg two tablets twice daily for 5 days and ritonavir 100 mg one tablet twice daily for 5 days) Patient GFR is > 60    Dispense:  30 tablet    Refill:  0   benzonatate (TESSALON PERLES) 100 MG capsule    Sig: 1-2 capsules up to twice daily as needed for cough.    Dispense:  30 capsule    Refill:  0     -I sent in the Covid19 treatment or referral you requested per our discussion. Please see the information provided below and discuss further with the pharmacist/treatment team.  -If taking Paxlovid, please review all medications, supplement and over the counter drugs with your pharmacist and ask them to check for any interactions.   -there is a chance of rebound illness with covid after improving. This can happen whether  or not you take an antiviral treatment. If you become sick again with covid after getting better, please schedule a follow up virtual visit and isolate again.  -can use tylenol if needed for fevers, aches and pains per instructions  -nasal saline sinus rinses twice daily  -stay hydrated, drink plenty of fluids and eat small healthy meals - avoid dairy  -follow up with your doctor in 2-3 days unless improving and feeling better  -stay home while sick, except to seek medical care. If you have COVID19, you will likely be contagious for 7-10 days. Flu or Influenza is likely contagious for about 7 days. Other respiratory viral infections remain contagious for 5-10+ days depending on the virus and many other factors. Wear a good mask that fits snugly (such as N95 or KN95) if around others to reduce the risk of transmission.  It was nice to meet you today, and I really hope you are feeling better soon. I help [facility] out with telemedicine visits on Tuesdays and Thursdays and am happy to help if you need a follow up virtual visit on those days. Otherwise, if you have any concerns or questions following this visit please schedule a follow up visit with your Primary Care doctor or seek care at a local urgent care clinic to avoid delays in care.    Seek in person care or schedule a follow up video visit promptly if your symptoms worsen, new concerns arise or you are not improving with treatment. Call 911 and/or seek emergency care if your symptoms are severe or life threatening.  Nirmatrelvir; Ritonavir Tablets What is  this medication? NIRMATRELVIR; RITONAVIR (NIR ma TREL vir; ri TOE na veer) treats mild to moderate COVID-19. It may help people who are at high risk of developing severe illness. It works by limiting the spread of the virus in your body. This medicine may be used for other purposes; ask your health care provider or pharmacist if you have questions. COMMON BRAND NAME(S): PAXLOVID What  should I tell my care team before I take this medication? They need to know if you have any of these conditions: Any allergies Any serious illness Kidney disease Liver disease An unusual or allergic reaction to nirmatrelvir, ritonavir, other medications, foods, dyes, or preservatives Pregnant or trying to get pregnant Breast-feeding How should I use this medication? This product contains 2 different medications that are packaged together. For the standard dose, take 2 pink tablets of nirmatrelvir with 1 white tablet of ritonavir (3 tablets total) by mouth with water twice daily. Talk to your care team if you have kidney disease. You may need a different dose. Swallow the tablets whole. You can take it with or without food. If it upsets your stomach, take it with food. Take all of this medication unless your care team tells you to stop it early. Keep taking it even if you think you are better. Talk to your care team about the use of this medication in children. While it may be prescribed for children as young as 12 years for selected conditions, precautions do apply. Overdosage: If you think you have taken too much of this medicine contact a poison control center or emergency room at once. NOTE: This medicine is only for you. Do not share this medicine with others. What if I miss a dose? If you miss a dose, take it as soon as you can unless it is more than 8 hours late. If it is more than 8 hours late, skip the missed dose. Take the next dose at the normal time. Do not take extra or 2 doses at the same time to make up for the missed dose. What may interact with this medication? Do not take this medication with any of the following: Alfuzosin Certain medications for anxiety or sleep, such as midazolam or triazolam Certain medications for cancer, such as apalutamide Certain medications for cholesterol, such as lovastatin or simvastatin Certain medications for irregular heartbeat, such as  amiodarone, dronedarone, flecainide, propafenone, quinidine Certain medications for mental health conditions, such as lurasidone or pimozide Certain medications for seizures, such as carbamazepine, phenobarbital, phenytoin, primidone Colchicine Eletriptan Eplerenone Ergot alkaloids, such as dihydroergotamine, ergotamine, methylergonovine Finerenone Flibanserin Ivabradine Lomitapide Lumacaftor; ivacaftor Naloxegol Ranolazine Red Yeast Rice Rifampin Rifapentine Sildenafil Silodosin St. John's wort Tolvaptan Ubrogepant Voclosporin This medication may affect how other medications work, and other medications may affect the way this medication works. Talk with your care team about all of the medications you take. They may suggest changes to your treatment plan to lower the risk of side effects and to make sure your medications work as intended. This list may not describe all possible interactions. Give your health care provider a list of all the medicines, herbs, non-prescription drugs, or dietary supplements you use. Also tell them if you smoke, drink alcohol, or use illegal drugs. Some items may interact with your medicine. What should I watch for while using this medication? Your condition will be monitored carefully while you are receiving this medication. Visit your care team for regular checkups. Tell your care team if your symptoms do not start  to get better or if they get worse. If you have untreated HIV infection, this medication may lead to some HIV medications not working as well in the future. Estrogen and progestin hormones may not work as well while you are taking this medication. Your care team can help you find the contraceptive option that works for you. What side effects may I notice from receiving this medication? Side effects that you should report to your care team as soon as possible: Allergic reactions--skin rash, itching, hives, swelling of the face, lips, tongue, or  throat Liver injury--right upper belly pain, loss of appetite, nausea, light-colored stool, dark yellow or brown urine, yellowing skin or eyes, unusual weakness or fatigue Redness, blistering, peeling, or loosening of the skin, including inside the mouth Side effects that usually do not require medical attention (report these to your care team if they continue or are bothersome): Change in taste Diarrhea General discomfort and fatigue Increase in blood pressure Muscle pain Nausea Stomach pain This list may not describe all possible side effects. Call your doctor for medical advice about side effects. You may report side effects to FDA at 1-800-FDA-1088. Where should I keep my medication? Keep out of the reach of children and pets. Store at room temperature between 20 and 25 degrees C (68 and 77 degrees F). Get rid of any unused medication after the expiration date. To get rid of medications that are no longer needed or have expired: Take the medication to a medication take-back program. Check with your pharmacy or law enforcement to find a location. If you cannot return the medication, check the label or package insert to see if the medication should be thrown out in the garbage or flushed down the toilet. If you are not sure, ask your care team. If it is safe to put it in the trash, take the medication out of the container. Mix the medication with cat litter, dirt, coffee grounds, or other unwanted substance. Seal the mixture in a bag or container. Put it in the trash. NOTE: This sheet is a summary. It may not cover all possible information. If you have questions about this medicine, talk to your doctor, pharmacist, or health care provider.  2024 Elsevier/Gold Standard (2023-01-27 00:00:00)

## 2023-08-28 ENCOUNTER — Encounter: Payer: BC Managed Care – PPO | Admitting: Family Medicine

## 2023-08-31 ENCOUNTER — Other Ambulatory Visit: Payer: Self-pay | Admitting: Family Medicine

## 2023-09-02 DIAGNOSIS — J301 Allergic rhinitis due to pollen: Secondary | ICD-10-CM | POA: Diagnosis not present

## 2023-09-02 DIAGNOSIS — J3089 Other allergic rhinitis: Secondary | ICD-10-CM | POA: Diagnosis not present

## 2023-09-02 DIAGNOSIS — J3081 Allergic rhinitis due to animal (cat) (dog) hair and dander: Secondary | ICD-10-CM | POA: Diagnosis not present

## 2023-09-09 DIAGNOSIS — J3081 Allergic rhinitis due to animal (cat) (dog) hair and dander: Secondary | ICD-10-CM | POA: Diagnosis not present

## 2023-09-09 DIAGNOSIS — J3089 Other allergic rhinitis: Secondary | ICD-10-CM | POA: Diagnosis not present

## 2023-09-09 DIAGNOSIS — J301 Allergic rhinitis due to pollen: Secondary | ICD-10-CM | POA: Diagnosis not present

## 2023-09-10 DIAGNOSIS — M79641 Pain in right hand: Secondary | ICD-10-CM | POA: Diagnosis not present

## 2023-09-12 DIAGNOSIS — M79641 Pain in right hand: Secondary | ICD-10-CM | POA: Diagnosis not present

## 2023-09-17 DIAGNOSIS — M79641 Pain in right hand: Secondary | ICD-10-CM | POA: Diagnosis not present

## 2023-09-18 DIAGNOSIS — F4322 Adjustment disorder with anxiety: Secondary | ICD-10-CM | POA: Diagnosis not present

## 2023-09-19 DIAGNOSIS — M79641 Pain in right hand: Secondary | ICD-10-CM | POA: Diagnosis not present

## 2023-09-22 DIAGNOSIS — M79641 Pain in right hand: Secondary | ICD-10-CM | POA: Diagnosis not present

## 2023-09-23 DIAGNOSIS — J3081 Allergic rhinitis due to animal (cat) (dog) hair and dander: Secondary | ICD-10-CM | POA: Diagnosis not present

## 2023-09-23 DIAGNOSIS — J3089 Other allergic rhinitis: Secondary | ICD-10-CM | POA: Diagnosis not present

## 2023-09-23 DIAGNOSIS — J301 Allergic rhinitis due to pollen: Secondary | ICD-10-CM | POA: Diagnosis not present

## 2023-09-29 ENCOUNTER — Encounter (HOSPITAL_COMMUNITY): Payer: Self-pay

## 2023-09-29 ENCOUNTER — Emergency Department (HOSPITAL_COMMUNITY)
Admission: EM | Admit: 2023-09-29 | Discharge: 2023-09-30 | Discharge status: Against Medical Advice | Payer: BC Managed Care – PPO | Attending: Emergency Medicine | Admitting: Emergency Medicine

## 2023-09-29 ENCOUNTER — Other Ambulatory Visit: Payer: Self-pay

## 2023-09-29 DIAGNOSIS — R55 Syncope and collapse: Secondary | ICD-10-CM | POA: Diagnosis not present

## 2023-09-29 DIAGNOSIS — R519 Headache, unspecified: Secondary | ICD-10-CM | POA: Diagnosis not present

## 2023-09-29 DIAGNOSIS — Z5321 Procedure and treatment not carried out due to patient leaving prior to being seen by health care provider: Secondary | ICD-10-CM | POA: Diagnosis not present

## 2023-09-29 DIAGNOSIS — I10 Essential (primary) hypertension: Secondary | ICD-10-CM | POA: Diagnosis not present

## 2023-09-29 DIAGNOSIS — R42 Dizziness and giddiness: Secondary | ICD-10-CM | POA: Diagnosis not present

## 2023-09-29 LAB — COMPREHENSIVE METABOLIC PANEL
ALT: 33 U/L (ref 0–44)
AST: 29 U/L (ref 15–41)
Albumin: 4.4 g/dL (ref 3.5–5.0)
Alkaline Phosphatase: 99 U/L (ref 38–126)
Anion gap: 8 (ref 5–15)
BUN: 14 mg/dL (ref 6–20)
CO2: 25 mmol/L (ref 22–32)
Calcium: 9.5 mg/dL (ref 8.9–10.3)
Chloride: 104 mmol/L (ref 98–111)
Creatinine, Ser: 0.82 mg/dL (ref 0.44–1.00)
GFR, Estimated: >60 mL/min (ref >60)
Glucose, Bld: 109 mg/dL — ABNORMAL HIGH (ref 70–99)
Potassium: 3.9 mmol/L (ref 3.5–5.1)
Sodium: 137 mmol/L (ref 135–145)
Total Bilirubin: 0.2 mg/dL — ABNORMAL LOW (ref 0.3–1.2)
Total Protein: 8.2 g/dL — ABNORMAL HIGH (ref 6.5–8.1)

## 2023-09-29 LAB — I-STAT CHEM 8, ED
BUN: 13 mg/dL (ref 6–20)
Calcium, Ion: 1.29 mmol/L (ref 1.15–1.40)
Chloride: 105 mmol/L (ref 98–111)
Creatinine, Ser: 0.8 mg/dL (ref 0.44–1.00)
Glucose, Bld: 107 mg/dL — ABNORMAL HIGH (ref 70–99)
HCT: 43 % (ref 36.0–46.0)
Hemoglobin: 14.6 g/dL (ref 12.0–15.0)
Potassium: 4 mmol/L (ref 3.5–5.1)
Sodium: 140 mmol/L (ref 135–145)
TCO2: 25 mmol/L (ref 22–32)

## 2023-09-29 LAB — APTT: aPTT: 25 s (ref 24–36)

## 2023-09-29 LAB — ETHANOL: Alcohol, Ethyl (B): <10 mg/dL (ref <10)

## 2023-09-29 NOTE — ED Provider Triage Note (Signed)
Emergency Medicine Provider Triage Evaluation Note  Deborah Stevens , a 57 y.o. female  was evaluated in triage.  Pt complains of Headache.  Patient has onset of right sided headache since yesterday with associated dizziness, pulsating sensation in right ear, intermittent ataxia and near syncope.  Had elevated pressures at home.  No history of the same.  Review of Systems  Positive: Headache and dizziness Negative: Fever  Physical Exam  BP (!) 141/80 (BP Location: Left Arm)   Pulse 86   Temp 97.9 F (36.6 C) (Oral)   Resp 17   LMP 09/03/2017   SpO2 98%  Gen:   Awake, no distress   Resp:  Normal effort  MSK:   Moves extremities without difficulty  Other:  No facial droop  Medical Decision Making  Medically screening exam initiated at 9:23 PM.  Appropriate orders placed.  Deborah Stevens was informed that the remainder of the evaluation will be completed by another provider, this initial triage assessment does not replace that evaluation, and the importance of remaining in the ED until their evaluation is complete.     Arthor Captain, PA-C 09/29/23 2124

## 2023-09-29 NOTE — ED Triage Notes (Signed)
Pt arrived from home via [facility] c/o HTN 205/100 at home. 141/80 in triage. Pt states that the HTN has been accompanied with near syncope, dizziness, intermittent ataxia since yesterday at 1100am sx have never fully resolved. NIH 0

## 2023-09-30 ENCOUNTER — Encounter: Payer: Self-pay | Admitting: Family Medicine

## 2023-09-30 NOTE — Telephone Encounter (Signed)
Pt is scheduled on 10/22/23, will this be soon enough? Thanks!  Josie Saunders, RMA

## 2023-10-02 ENCOUNTER — Encounter: Payer: Self-pay | Admitting: Family Medicine

## 2023-10-02 ENCOUNTER — Ambulatory Visit (INDEPENDENT_AMBULATORY_CARE_PROVIDER_SITE_OTHER): Payer: BC Managed Care – PPO | Admitting: Family Medicine

## 2023-10-02 VITALS — BP 112/65 | HR 76 | Temp 98.1°F | Ht 68.0 in | Wt 222.4 lb

## 2023-10-02 DIAGNOSIS — Z8679 Personal history of other diseases of the circulatory system: Secondary | ICD-10-CM

## 2023-10-02 DIAGNOSIS — Z630 Problems in relationship with spouse or partner: Secondary | ICD-10-CM | POA: Diagnosis not present

## 2023-10-02 DIAGNOSIS — Z23 Encounter for immunization: Secondary | ICD-10-CM

## 2023-10-02 DIAGNOSIS — R030 Elevated blood-pressure reading, without diagnosis of hypertension: Secondary | ICD-10-CM | POA: Diagnosis not present

## 2023-10-02 DIAGNOSIS — F4322 Adjustment disorder with anxiety: Secondary | ICD-10-CM | POA: Diagnosis not present
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
## 2023-10-07 DIAGNOSIS — J301 Allergic rhinitis due to pollen: Secondary | ICD-10-CM | POA: Diagnosis not present

## 2023-10-07 DIAGNOSIS — J3089 Other allergic rhinitis: Secondary | ICD-10-CM | POA: Diagnosis not present

## 2023-10-07 DIAGNOSIS — J3081 Allergic rhinitis due to animal (cat) (dog) hair and dander: Secondary | ICD-10-CM | POA: Diagnosis not present

## 2023-10-15 DIAGNOSIS — J301 Allergic rhinitis due to pollen: Secondary | ICD-10-CM | POA: Diagnosis not present

## 2023-10-16 DIAGNOSIS — J3089 Other allergic rhinitis: Secondary | ICD-10-CM | POA: Diagnosis not present

## 2023-10-16 DIAGNOSIS — F4322 Adjustment disorder with anxiety: Secondary | ICD-10-CM | POA: Diagnosis not present

## 2023-10-16 DIAGNOSIS — J3081 Allergic rhinitis due to animal (cat) (dog) hair and dander: Secondary | ICD-10-CM | POA: Diagnosis not present

## 2023-10-21 DIAGNOSIS — J3089 Other allergic rhinitis: Secondary | ICD-10-CM | POA: Diagnosis not present

## 2023-10-21 DIAGNOSIS — J3081 Allergic rhinitis due to animal (cat) (dog) hair and dander: Secondary | ICD-10-CM | POA: Diagnosis not present

## 2023-10-21 DIAGNOSIS — J301 Allergic rhinitis due to pollen: Secondary | ICD-10-CM | POA: Diagnosis not present

## 2023-10-22 ENCOUNTER — Encounter: Payer: Self-pay | Admitting: Family Medicine

## 2023-10-22 ENCOUNTER — Ambulatory Visit: Payer: BC Managed Care – PPO | Admitting: Family Medicine

## 2023-10-22 VITALS — BP 126/80 | HR 84 | Temp 97.9°F | Ht 68.0 in | Wt 220.4 lb

## 2023-10-22 DIAGNOSIS — J309 Allergic rhinitis, unspecified: Secondary | ICD-10-CM | POA: Diagnosis not present

## 2023-10-22 DIAGNOSIS — Z860101 Personal history of adenomatous and serrated colon polyps: Secondary | ICD-10-CM | POA: Diagnosis not present

## 2023-10-22 DIAGNOSIS — Z0001 Encounter for general adult medical examination with abnormal findings: Secondary | ICD-10-CM

## 2023-10-22 DIAGNOSIS — E559 Vitamin D deficiency, unspecified: Secondary | ICD-10-CM

## 2023-10-22 LAB — COMPREHENSIVE METABOLIC PANEL
ALT: 27 U/L (ref 0–35)
AST: 21 U/L (ref 0–37)
Albumin: 4.2 g/dL (ref 3.5–5.2)
Alkaline Phosphatase: 103 U/L (ref 39–117)
BUN: 11 mg/dL (ref 6–23)
CO2: 27 meq/L (ref 19–32)
Calcium: 9.9 mg/dL (ref 8.4–10.5)
Chloride: 104 meq/L (ref 96–112)
Creatinine, Ser: 0.8 mg/dL (ref 0.40–1.20)
GFR: 81.93 mL/min (ref >60.00)
Glucose, Bld: 86 mg/dL (ref 70–99)
Potassium: 3.9 meq/L (ref 3.5–5.1)
Sodium: 139 meq/L (ref 135–145)
Total Bilirubin: 0.4 mg/dL (ref 0.2–1.2)
Total Protein: 7.6 g/dL (ref 6.0–8.3)

## 2023-10-22 LAB — CBC WITH DIFFERENTIAL/PLATELET
Basophils Absolute: 0.1 10*3/uL (ref 0.0–0.1)
Basophils Relative: 0.9 % (ref 0.0–3.0)
Eosinophils Absolute: 0.1 10*3/uL (ref 0.0–0.7)
Eosinophils Relative: 1.5 % (ref 0.0–5.0)
HCT: 41.8 % (ref 36.0–46.0)
Hemoglobin: 13.5 g/dL (ref 12.0–15.0)
Lymphocytes Relative: 40.5 % (ref 12.0–46.0)
Lymphs Abs: 2.3 10*3/uL (ref 0.7–4.0)
MCHC: 32.4 g/dL (ref 30.0–36.0)
MCV: 89.8 fL (ref 78.0–100.0)
Monocytes Absolute: 0.3 10*3/uL (ref 0.1–1.0)
Monocytes Relative: 4.7 % (ref 3.0–12.0)
Neutro Abs: 3 10*3/uL (ref 1.4–7.7)
Neutrophils Relative %: 52.4 % (ref 43.0–77.0)
Platelets: 220 10*3/uL (ref 150.0–400.0)
RBC: 4.65 Mil/uL (ref 3.87–5.11)
RDW: 14 % (ref 11.5–15.5)
WBC: 5.7 10*3/uL (ref 4.0–10.5)

## 2023-10-22 LAB — LIPID PANEL
Cholesterol: 177 mg/dL (ref 0–200)
HDL: 62.4 mg/dL (ref >39.00)
LDL Cholesterol: 98 mg/dL (ref 0–99)
NonHDL: 114.68
Total CHOL/HDL Ratio: 3
Triglycerides: 83 mg/dL (ref 0.0–149.0)
VLDL: 16.6 mg/dL (ref 0.0–40.0)

## 2023-10-22 LAB — VITAMIN D 25 HYDROXY (VIT D DEFICIENCY, FRACTURES): VITD: 39.12 ng/mL (ref 30.00–100.00)

## 2023-10-22 LAB — TSH: TSH: 1.1 u[IU]/mL (ref 0.35–5.50)

## 2023-10-22 NOTE — Patient Instructions (Signed)
Please return in 12 months for your annual complete physical; please come fasting.   I will release your lab results to you on your MyChart account with further instructions. You may see the results before I do, but when I review them I will send you a message with my report or have my assistant call you if things need to be discussed. Please reply to my message with any questions. Thank you!   If you have any questions or concerns, please don't hesitate to send me a message via MyChart or call the office at [phone]. Thank you for visiting with us today! It's our pleasure caring for you.  
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
## 2023-10-27 ENCOUNTER — Encounter: Payer: Self-pay | Admitting: Family Medicine

## 2023-10-30 NOTE — Progress Notes (Signed)
See mychart note Dear Ms. Haaland, Your lab results are all perfect!!!! Very good news!  Glad you are well :) Sincerely, Dr. Mardelle Matte

## 2023-11-05 DIAGNOSIS — J301 Allergic rhinitis due to pollen: Secondary | ICD-10-CM | POA: Diagnosis not present

## 2023-11-05 DIAGNOSIS — J3081 Allergic rhinitis due to animal (cat) (dog) hair and dander: Secondary | ICD-10-CM | POA: Diagnosis not present

## 2023-11-05 DIAGNOSIS — J3089 Other allergic rhinitis: Secondary | ICD-10-CM | POA: Diagnosis not present

## 2023-11-12 DIAGNOSIS — J3089 Other allergic rhinitis: Secondary | ICD-10-CM | POA: Diagnosis not present

## 2023-11-12 DIAGNOSIS — J3081 Allergic rhinitis due to animal (cat) (dog) hair and dander: Secondary | ICD-10-CM | POA: Diagnosis not present

## 2023-11-12 DIAGNOSIS — J301 Allergic rhinitis due to pollen: Secondary | ICD-10-CM | POA: Diagnosis not present

## 2023-11-13 DIAGNOSIS — F4322 Adjustment disorder with anxiety: Secondary | ICD-10-CM | POA: Diagnosis not present

## 2023-11-18 DIAGNOSIS — J3089 Other allergic rhinitis: Secondary | ICD-10-CM | POA: Diagnosis not present

## 2023-11-18 DIAGNOSIS — J3081 Allergic rhinitis due to animal (cat) (dog) hair and dander: Secondary | ICD-10-CM | POA: Diagnosis not present

## 2023-11-18 DIAGNOSIS — J301 Allergic rhinitis due to pollen: Secondary | ICD-10-CM | POA: Diagnosis not present

## 2023-11-26 DIAGNOSIS — J3081 Allergic rhinitis due to animal (cat) (dog) hair and dander: Secondary | ICD-10-CM | POA: Diagnosis not present

## 2023-11-26 DIAGNOSIS — J3089 Other allergic rhinitis: Secondary | ICD-10-CM | POA: Diagnosis not present

## 2023-11-26 DIAGNOSIS — J301 Allergic rhinitis due to pollen: Secondary | ICD-10-CM | POA: Diagnosis not present

## 2023-11-27 DIAGNOSIS — F4322 Adjustment disorder with anxiety: Secondary | ICD-10-CM | POA: Diagnosis not present

## 2023-12-02 DIAGNOSIS — J301 Allergic rhinitis due to pollen: Secondary | ICD-10-CM | POA: Diagnosis not present

## 2023-12-02 DIAGNOSIS — J3089 Other allergic rhinitis: Secondary | ICD-10-CM | POA: Diagnosis not present

## 2023-12-02 DIAGNOSIS — J3081 Allergic rhinitis due to animal (cat) (dog) hair and dander: Secondary | ICD-10-CM | POA: Diagnosis not present

## 2023-12-09 DIAGNOSIS — J301 Allergic rhinitis due to pollen: Secondary | ICD-10-CM | POA: Diagnosis not present

## 2023-12-09 DIAGNOSIS — J3089 Other allergic rhinitis: Secondary | ICD-10-CM | POA: Diagnosis not present

## 2023-12-09 DIAGNOSIS — J3081 Allergic rhinitis due to animal (cat) (dog) hair and dander: Secondary | ICD-10-CM | POA: Diagnosis not present

## 2023-12-11 DIAGNOSIS — F4322 Adjustment disorder with anxiety: Secondary | ICD-10-CM | POA: Diagnosis not present

## 2023-12-18 DIAGNOSIS — J3089 Other allergic rhinitis: Secondary | ICD-10-CM | POA: Diagnosis not present

## 2023-12-18 DIAGNOSIS — J3081 Allergic rhinitis due to animal (cat) (dog) hair and dander: Secondary | ICD-10-CM | POA: Diagnosis not present

## 2023-12-18 DIAGNOSIS — J301 Allergic rhinitis due to pollen: Secondary | ICD-10-CM | POA: Diagnosis not present

## 2023-12-25 DIAGNOSIS — J3089 Other allergic rhinitis: Secondary | ICD-10-CM | POA: Diagnosis not present

## 2023-12-25 DIAGNOSIS — J301 Allergic rhinitis due to pollen: Secondary | ICD-10-CM | POA: Diagnosis not present

## 2023-12-25 DIAGNOSIS — J3081 Allergic rhinitis due to animal (cat) (dog) hair and dander: Secondary | ICD-10-CM | POA: Diagnosis not present

## 2023-12-30 DIAGNOSIS — J3089 Other allergic rhinitis: Secondary | ICD-10-CM | POA: Diagnosis not present

## 2023-12-30 DIAGNOSIS — J3081 Allergic rhinitis due to animal (cat) (dog) hair and dander: Secondary | ICD-10-CM | POA: Diagnosis not present

## 2023-12-30 DIAGNOSIS — J301 Allergic rhinitis due to pollen: Secondary | ICD-10-CM | POA: Diagnosis not present

## 2023-12-31 ENCOUNTER — Encounter: Payer: Self-pay | Admitting: Family Medicine

## 2024-01-01 NOTE — Telephone Encounter (Signed)
 Noted.

## 2024-01-05 DIAGNOSIS — Z0279 Encounter for issue of other medical certificate: Secondary | ICD-10-CM

## 2024-01-07 DIAGNOSIS — J3089 Other allergic rhinitis: Secondary | ICD-10-CM | POA: Diagnosis not present

## 2024-01-07 DIAGNOSIS — J3081 Allergic rhinitis due to animal (cat) (dog) hair and dander: Secondary | ICD-10-CM | POA: Diagnosis not present

## 2024-01-07 DIAGNOSIS — J301 Allergic rhinitis due to pollen: Secondary | ICD-10-CM | POA: Diagnosis not present

## 2024-01-10 ENCOUNTER — Other Ambulatory Visit: Payer: Self-pay | Admitting: Family Medicine

## 2024-01-21 DIAGNOSIS — J3081 Allergic rhinitis due to animal (cat) (dog) hair and dander: Secondary | ICD-10-CM | POA: Diagnosis not present

## 2024-01-21 DIAGNOSIS — J3089 Other allergic rhinitis: Secondary | ICD-10-CM | POA: Diagnosis not present

## 2024-01-21 DIAGNOSIS — J301 Allergic rhinitis due to pollen: Secondary | ICD-10-CM | POA: Diagnosis not present

## 2024-01-30 DIAGNOSIS — Z1231 Encounter for screening mammogram for malignant neoplasm of breast: Secondary | ICD-10-CM | POA: Diagnosis not present

## 2024-01-30 LAB — HM MAMMOGRAPHY

## 2024-02-02 ENCOUNTER — Encounter: Payer: Self-pay | Admitting: Family Medicine

## 2024-02-03 DIAGNOSIS — J301 Allergic rhinitis due to pollen: Secondary | ICD-10-CM | POA: Diagnosis not present

## 2024-02-03 DIAGNOSIS — J3081 Allergic rhinitis due to animal (cat) (dog) hair and dander: Secondary | ICD-10-CM | POA: Diagnosis not present

## 2024-02-03 DIAGNOSIS — J3089 Other allergic rhinitis: Secondary | ICD-10-CM | POA: Diagnosis not present

## 2024-02-17 DIAGNOSIS — J301 Allergic rhinitis due to pollen: Secondary | ICD-10-CM | POA: Diagnosis not present

## 2024-02-17 DIAGNOSIS — J3089 Other allergic rhinitis: Secondary | ICD-10-CM | POA: Diagnosis not present

## 2024-02-17 DIAGNOSIS — J3081 Allergic rhinitis due to animal (cat) (dog) hair and dander: Secondary | ICD-10-CM | POA: Diagnosis not present

## 2024-03-01 DIAGNOSIS — B9689 Other specified bacterial agents as the cause of diseases classified elsewhere: Secondary | ICD-10-CM | POA: Diagnosis not present

## 2024-03-01 DIAGNOSIS — J208 Acute bronchitis due to other specified organisms: Secondary | ICD-10-CM | POA: Diagnosis not present

## 2024-03-01 DIAGNOSIS — F4322 Adjustment disorder with anxiety: Secondary | ICD-10-CM | POA: Diagnosis not present

## 2024-03-01 DIAGNOSIS — J040 Acute laryngitis: Secondary | ICD-10-CM | POA: Diagnosis not present

## 2024-03-10 DIAGNOSIS — J3089 Other allergic rhinitis: Secondary | ICD-10-CM | POA: Diagnosis not present

## 2024-03-10 DIAGNOSIS — J3081 Allergic rhinitis due to animal (cat) (dog) hair and dander: Secondary | ICD-10-CM | POA: Diagnosis not present

## 2024-03-10 DIAGNOSIS — J301 Allergic rhinitis due to pollen: Secondary | ICD-10-CM | POA: Diagnosis not present

## 2024-03-18 DIAGNOSIS — F4322 Adjustment disorder with anxiety: Secondary | ICD-10-CM | POA: Diagnosis not present

## 2024-03-22 DIAGNOSIS — F4322 Adjustment disorder with anxiety: Secondary | ICD-10-CM | POA: Diagnosis not present

## 2024-03-25 DIAGNOSIS — J3089 Other allergic rhinitis: Secondary | ICD-10-CM | POA: Diagnosis not present

## 2024-03-25 DIAGNOSIS — J3081 Allergic rhinitis due to animal (cat) (dog) hair and dander: Secondary | ICD-10-CM | POA: Diagnosis not present

## 2024-03-25 DIAGNOSIS — J301 Allergic rhinitis due to pollen: Secondary | ICD-10-CM | POA: Diagnosis not present

## 2024-03-31 DIAGNOSIS — F4322 Adjustment disorder with anxiety: Secondary | ICD-10-CM | POA: Diagnosis not present

## 2024-04-05 DIAGNOSIS — N898 Other specified noninflammatory disorders of vagina: Secondary | ICD-10-CM | POA: Diagnosis not present

## 2024-04-05 DIAGNOSIS — Z01419 Encounter for gynecological examination (general) (routine) without abnormal findings: Secondary | ICD-10-CM | POA: Diagnosis not present

## 2024-04-05 DIAGNOSIS — N760 Acute vaginitis: Secondary | ICD-10-CM | POA: Diagnosis not present

## 2024-04-06 DIAGNOSIS — J3081 Allergic rhinitis due to animal (cat) (dog) hair and dander: Secondary | ICD-10-CM | POA: Diagnosis not present

## 2024-04-06 DIAGNOSIS — J3089 Other allergic rhinitis: Secondary | ICD-10-CM | POA: Diagnosis not present

## 2024-04-06 DIAGNOSIS — J301 Allergic rhinitis due to pollen: Secondary | ICD-10-CM | POA: Diagnosis not present

## 2024-04-13 DIAGNOSIS — F4322 Adjustment disorder with anxiety: Secondary | ICD-10-CM | POA: Diagnosis not present

## 2024-04-14 DIAGNOSIS — J3089 Other allergic rhinitis: Secondary | ICD-10-CM | POA: Diagnosis not present

## 2024-04-14 DIAGNOSIS — J3081 Allergic rhinitis due to animal (cat) (dog) hair and dander: Secondary | ICD-10-CM | POA: Diagnosis not present

## 2024-04-14 DIAGNOSIS — J301 Allergic rhinitis due to pollen: Secondary | ICD-10-CM | POA: Diagnosis not present

## 2024-04-27 DIAGNOSIS — F4322 Adjustment disorder with anxiety: Secondary | ICD-10-CM | POA: Diagnosis not present

## 2024-04-28 DIAGNOSIS — J3081 Allergic rhinitis due to animal (cat) (dog) hair and dander: Secondary | ICD-10-CM | POA: Diagnosis not present

## 2024-04-28 DIAGNOSIS — J301 Allergic rhinitis due to pollen: Secondary | ICD-10-CM | POA: Diagnosis not present

## 2024-04-28 DIAGNOSIS — J3089 Other allergic rhinitis: Secondary | ICD-10-CM | POA: Diagnosis not present

## 2024-05-04 DIAGNOSIS — F4322 Adjustment disorder with anxiety: Secondary | ICD-10-CM | POA: Diagnosis not present

## 2024-05-12 DIAGNOSIS — J3081 Allergic rhinitis due to animal (cat) (dog) hair and dander: Secondary | ICD-10-CM | POA: Diagnosis not present

## 2024-05-12 DIAGNOSIS — J301 Allergic rhinitis due to pollen: Secondary | ICD-10-CM | POA: Diagnosis not present

## 2024-05-12 DIAGNOSIS — J3089 Other allergic rhinitis: Secondary | ICD-10-CM | POA: Diagnosis not present

## 2024-05-23 ENCOUNTER — Other Ambulatory Visit: Payer: Self-pay | Admitting: Family Medicine

## 2024-05-25 DIAGNOSIS — F4322 Adjustment disorder with anxiety: Secondary | ICD-10-CM | POA: Diagnosis not present

## 2024-05-26 DIAGNOSIS — J3089 Other allergic rhinitis: Secondary | ICD-10-CM | POA: Diagnosis not present

## 2024-05-26 DIAGNOSIS — J301 Allergic rhinitis due to pollen: Secondary | ICD-10-CM | POA: Diagnosis not present

## 2024-05-26 DIAGNOSIS — J3081 Allergic rhinitis due to animal (cat) (dog) hair and dander: Secondary | ICD-10-CM | POA: Diagnosis not present

## 2024-06-03 DIAGNOSIS — J301 Allergic rhinitis due to pollen: Secondary | ICD-10-CM | POA: Diagnosis not present

## 2024-06-04 DIAGNOSIS — J3089 Other allergic rhinitis: Secondary | ICD-10-CM | POA: Diagnosis not present

## 2024-06-04 DIAGNOSIS — J3081 Allergic rhinitis due to animal (cat) (dog) hair and dander: Secondary | ICD-10-CM | POA: Diagnosis not present

## 2024-06-08 DIAGNOSIS — F4322 Adjustment disorder with anxiety: Secondary | ICD-10-CM | POA: Diagnosis not present

## 2024-06-09 DIAGNOSIS — J3089 Other allergic rhinitis: Secondary | ICD-10-CM | POA: Diagnosis not present

## 2024-06-09 DIAGNOSIS — J3081 Allergic rhinitis due to animal (cat) (dog) hair and dander: Secondary | ICD-10-CM | POA: Diagnosis not present

## 2024-06-09 DIAGNOSIS — J301 Allergic rhinitis due to pollen: Secondary | ICD-10-CM | POA: Diagnosis not present

## 2024-06-22 DIAGNOSIS — F4322 Adjustment disorder with anxiety: Secondary | ICD-10-CM | POA: Diagnosis not present

## 2024-06-23 DIAGNOSIS — J3081 Allergic rhinitis due to animal (cat) (dog) hair and dander: Secondary | ICD-10-CM | POA: Diagnosis not present

## 2024-06-23 DIAGNOSIS — J3089 Other allergic rhinitis: Secondary | ICD-10-CM | POA: Diagnosis not present

## 2024-06-23 DIAGNOSIS — J301 Allergic rhinitis due to pollen: Secondary | ICD-10-CM | POA: Diagnosis not present

## 2024-06-29 DIAGNOSIS — J3081 Allergic rhinitis due to animal (cat) (dog) hair and dander: Secondary | ICD-10-CM | POA: Diagnosis not present

## 2024-06-29 DIAGNOSIS — J301 Allergic rhinitis due to pollen: Secondary | ICD-10-CM | POA: Diagnosis not present

## 2024-06-29 DIAGNOSIS — J3089 Other allergic rhinitis: Secondary | ICD-10-CM | POA: Diagnosis not present

## 2024-07-06 DIAGNOSIS — F4322 Adjustment disorder with anxiety: Secondary | ICD-10-CM | POA: Diagnosis not present

## 2024-07-08 DIAGNOSIS — J301 Allergic rhinitis due to pollen: Secondary | ICD-10-CM | POA: Diagnosis not present

## 2024-07-08 DIAGNOSIS — J3089 Other allergic rhinitis: Secondary | ICD-10-CM | POA: Diagnosis not present

## 2024-07-08 DIAGNOSIS — J3081 Allergic rhinitis due to animal (cat) (dog) hair and dander: Secondary | ICD-10-CM | POA: Diagnosis not present

## 2024-07-14 DIAGNOSIS — J3089 Other allergic rhinitis: Secondary | ICD-10-CM | POA: Diagnosis not present

## 2024-07-14 DIAGNOSIS — J3081 Allergic rhinitis due to animal (cat) (dog) hair and dander: Secondary | ICD-10-CM | POA: Diagnosis not present

## 2024-07-14 DIAGNOSIS — J301 Allergic rhinitis due to pollen: Secondary | ICD-10-CM | POA: Diagnosis not present

## 2024-07-14 DIAGNOSIS — H1045 Other chronic allergic conjunctivitis: Secondary | ICD-10-CM | POA: Diagnosis not present

## 2024-07-14 DIAGNOSIS — R052 Subacute cough: Secondary | ICD-10-CM | POA: Diagnosis not present

## 2024-07-14 DIAGNOSIS — R21 Rash and other nonspecific skin eruption: Secondary | ICD-10-CM | POA: Diagnosis not present

## 2024-07-20 DIAGNOSIS — F4322 Adjustment disorder with anxiety: Secondary | ICD-10-CM | POA: Diagnosis not present

## 2024-07-21 DIAGNOSIS — J3081 Allergic rhinitis due to animal (cat) (dog) hair and dander: Secondary | ICD-10-CM | POA: Diagnosis not present

## 2024-07-21 DIAGNOSIS — J301 Allergic rhinitis due to pollen: Secondary | ICD-10-CM | POA: Diagnosis not present

## 2024-07-21 DIAGNOSIS — J3089 Other allergic rhinitis: Secondary | ICD-10-CM | POA: Diagnosis not present

## 2024-08-03 DIAGNOSIS — F4322 Adjustment disorder with anxiety: Secondary | ICD-10-CM | POA: Diagnosis not present

## 2024-08-10 DIAGNOSIS — H04123 Dry eye syndrome of bilateral lacrimal glands: Secondary | ICD-10-CM | POA: Diagnosis not present

## 2024-08-17 DIAGNOSIS — F4322 Adjustment disorder with anxiety: Secondary | ICD-10-CM | POA: Diagnosis not present

## 2024-08-18 DIAGNOSIS — J301 Allergic rhinitis due to pollen: Secondary | ICD-10-CM | POA: Diagnosis not present

## 2024-08-18 DIAGNOSIS — J3081 Allergic rhinitis due to animal (cat) (dog) hair and dander: Secondary | ICD-10-CM | POA: Diagnosis not present

## 2024-08-18 DIAGNOSIS — J3089 Other allergic rhinitis: Secondary | ICD-10-CM | POA: Diagnosis not present

## 2024-08-20 DIAGNOSIS — M65932 Unspecified synovitis and tenosynovitis, left forearm: Secondary | ICD-10-CM | POA: Diagnosis not present

## 2024-08-20 DIAGNOSIS — M79642 Pain in left hand: Secondary | ICD-10-CM | POA: Diagnosis not present

## 2024-08-24 DIAGNOSIS — J3089 Other allergic rhinitis: Secondary | ICD-10-CM | POA: Diagnosis not present

## 2024-08-24 DIAGNOSIS — J301 Allergic rhinitis due to pollen: Secondary | ICD-10-CM | POA: Diagnosis not present

## 2024-08-24 DIAGNOSIS — J3081 Allergic rhinitis due to animal (cat) (dog) hair and dander: Secondary | ICD-10-CM | POA: Diagnosis not present

## 2024-08-31 DIAGNOSIS — F4322 Adjustment disorder with anxiety: Secondary | ICD-10-CM | POA: Diagnosis not present

## 2024-09-01 DIAGNOSIS — J301 Allergic rhinitis due to pollen: Secondary | ICD-10-CM | POA: Diagnosis not present

## 2024-09-01 DIAGNOSIS — J3089 Other allergic rhinitis: Secondary | ICD-10-CM | POA: Diagnosis not present

## 2024-09-01 DIAGNOSIS — J3081 Allergic rhinitis due to animal (cat) (dog) hair and dander: Secondary | ICD-10-CM | POA: Diagnosis not present

## 2024-09-09 DIAGNOSIS — J301 Allergic rhinitis due to pollen: Secondary | ICD-10-CM | POA: Diagnosis not present

## 2024-09-09 DIAGNOSIS — J3089 Other allergic rhinitis: Secondary | ICD-10-CM | POA: Diagnosis not present

## 2024-09-09 DIAGNOSIS — J3081 Allergic rhinitis due to animal (cat) (dog) hair and dander: Secondary | ICD-10-CM | POA: Diagnosis not present

## 2024-09-14 ENCOUNTER — Encounter: Payer: Self-pay | Admitting: Internal Medicine

## 2024-09-14 DIAGNOSIS — F4310 Post-traumatic stress disorder, unspecified: Secondary | ICD-10-CM | POA: Diagnosis not present

## 2024-09-15 DIAGNOSIS — J3081 Allergic rhinitis due to animal (cat) (dog) hair and dander: Secondary | ICD-10-CM | POA: Diagnosis not present

## 2024-09-15 DIAGNOSIS — J301 Allergic rhinitis due to pollen: Secondary | ICD-10-CM | POA: Diagnosis not present

## 2024-09-15 DIAGNOSIS — J3089 Other allergic rhinitis: Secondary | ICD-10-CM | POA: Diagnosis not present

## 2024-09-22 DIAGNOSIS — J3089 Other allergic rhinitis: Secondary | ICD-10-CM | POA: Diagnosis not present

## 2024-09-22 DIAGNOSIS — J3081 Allergic rhinitis due to animal (cat) (dog) hair and dander: Secondary | ICD-10-CM | POA: Diagnosis not present

## 2024-09-22 DIAGNOSIS — J301 Allergic rhinitis due to pollen: Secondary | ICD-10-CM | POA: Diagnosis not present

## 2024-09-28 DIAGNOSIS — F4310 Post-traumatic stress disorder, unspecified: Secondary | ICD-10-CM | POA: Diagnosis not present

## 2024-09-29 DIAGNOSIS — J301 Allergic rhinitis due to pollen: Secondary | ICD-10-CM | POA: Diagnosis not present

## 2024-09-29 DIAGNOSIS — F4310 Post-traumatic stress disorder, unspecified: Secondary | ICD-10-CM | POA: Diagnosis not present

## 2024-09-29 DIAGNOSIS — J3089 Other allergic rhinitis: Secondary | ICD-10-CM | POA: Diagnosis not present

## 2024-09-29 DIAGNOSIS — J3081 Allergic rhinitis due to animal (cat) (dog) hair and dander: Secondary | ICD-10-CM | POA: Diagnosis not present

## 2024-10-05 DIAGNOSIS — J301 Allergic rhinitis due to pollen: Secondary | ICD-10-CM | POA: Diagnosis not present

## 2024-10-05 DIAGNOSIS — J3089 Other allergic rhinitis: Secondary | ICD-10-CM | POA: Diagnosis not present

## 2024-10-05 DIAGNOSIS — J3081 Allergic rhinitis due to animal (cat) (dog) hair and dander: Secondary | ICD-10-CM | POA: Diagnosis not present

## 2024-10-06 ENCOUNTER — Encounter: Payer: Self-pay | Admitting: Internal Medicine

## 2024-10-06 ENCOUNTER — Other Ambulatory Visit: Payer: Self-pay | Admitting: Medical Genetics

## 2024-10-06 ENCOUNTER — Ambulatory Visit (AMBULATORY_SURGERY_CENTER)

## 2024-10-06 VITALS — Ht 68.0 in | Wt 225.0 lb

## 2024-10-06 DIAGNOSIS — Z860100 Personal history of colon polyps, unspecified: Secondary | ICD-10-CM

## 2024-10-06 MED ORDER — NA SULFATE-K SULFATE-MG SULF 17.5-3.13-1.6 GM/177ML PO SOLN
1.0000 | Freq: Once | ORAL | 0 refills | Status: AC
Start: 2024-10-06 — End: 2024-10-06

## 2024-10-06 NOTE — Progress Notes (Signed)
 No egg or soy allergy known to patient  No issues known to pt with past sedation with any surgeries or procedures Patient denies ever being told they had issues or difficulty with intubation  No FH of Malignant Hyperthermia Pt is not on diet pills Pt is not on  home 02  Pt is not on blood thinners  Pt denies issues with constipation  No A fib or A flutter Have any cardiac testing pending--no LOA: independent  Prep: suprep   PV completed with patient. Prep instructions sent via mychart

## 2024-10-07 ENCOUNTER — Other Ambulatory Visit: Payer: Self-pay | Admitting: Family Medicine

## 2024-10-12 DIAGNOSIS — F4321 Adjustment disorder with depressed mood: Secondary | ICD-10-CM | POA: Diagnosis not present
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
## 2024-10-13 ENCOUNTER — Ambulatory Visit (AMBULATORY_SURGERY_CENTER): Admitting: Internal Medicine

## 2024-10-13 ENCOUNTER — Encounter: Payer: Self-pay | Admitting: Internal Medicine

## 2024-10-13 VITALS — BP 125/71 | HR 71 | Temp 97.3°F | Resp 18 | Ht 68.0 in | Wt 225.0 lb

## 2024-10-13 DIAGNOSIS — Z1211 Encounter for screening for malignant neoplasm of colon: Secondary | ICD-10-CM

## 2024-10-13 DIAGNOSIS — Z860101 Personal history of adenomatous and serrated colon polyps: Secondary | ICD-10-CM | POA: Diagnosis not present

## 2024-10-13 DIAGNOSIS — J3089 Other allergic rhinitis: Secondary | ICD-10-CM | POA: Diagnosis not present

## 2024-10-13 DIAGNOSIS — J3081 Allergic rhinitis due to animal (cat) (dog) hair and dander: Secondary | ICD-10-CM | POA: Diagnosis not present

## 2024-10-13 DIAGNOSIS — K5730 Diverticulosis of large intestine without perforation or abscess without bleeding: Secondary | ICD-10-CM | POA: Diagnosis not present

## 2024-10-13 DIAGNOSIS — Z860100 Personal history of colon polyps, unspecified: Secondary | ICD-10-CM

## 2024-10-13 DIAGNOSIS — J301 Allergic rhinitis due to pollen: Secondary | ICD-10-CM | POA: Diagnosis not present

## 2024-10-13 MED ORDER — SODIUM CHLORIDE 0.9 % IV SOLN: 500.0000 mL | Freq: Once | INTRAVENOUS | Status: DC

## 2024-10-13 NOTE — Progress Notes (Signed)
 Vss nad trans to pacu

## 2024-10-13 NOTE — Patient Instructions (Addendum)
 Please read handouts provided. Continue present medications. Repeat colonoscopy in 10 years for screening. Resume previous diet.   YOU HAD AN ENDOSCOPIC PROCEDURE TODAY AT THE [facility]:   Refer to the procedure report that was given to you for any specific questions about what was found during the examination.  If the procedure report does not answer your questions, please call your gastroenterologist to clarify.  If you requested that your care partner not be given the details of your procedure findings, then the procedure report has been included in a sealed envelope for you to review at your convenience later.  YOU SHOULD EXPECT: Some feelings of bloating in the abdomen. Passage of more gas than usual.  Walking can help get rid of the air that was put into your GI tract during the procedure and reduce the bloating. If you had a lower endoscopy (such as a colonoscopy or flexible sigmoidoscopy) you may notice spotting of blood in your stool or on the toilet paper. If you underwent a bowel prep for your procedure, you may not have a normal bowel movement for a few days.  Please Note:  You might notice some irritation and congestion in your nose or some drainage.  This is from the oxygen used during your procedure.  There is no need for concern and it should clear up in a day or so.  SYMPTOMS TO REPORT IMMEDIATELY:  Following lower endoscopy (colonoscopy or flexible sigmoidoscopy):  Excessive amounts of blood in the stool  Significant tenderness or worsening of abdominal pains  Swelling of the abdomen that is new, acute  Fever of 100F or higher   For urgent or emergent issues, a gastroenterologist can be reached at any hour by calling [phone]. Do not use MyChart messaging for urgent concerns.    DIET:  We do recommend a small meal at first, but then you may proceed to your regular diet.  Drink plenty of fluids but you should avoid alcoholic beverages for 24  hours.  ACTIVITY:  You should plan to take it easy for the rest of today and you should NOT DRIVE or use heavy machinery until tomorrow (because of the sedation medicines used during the test).    FOLLOW UP: Our staff will call the number listed on your records the next business day following your procedure.  We will call around 7:15- 8:00 am to check on you and address any questions or concerns that you may have regarding the information given to you following your procedure. If we do not reach you, we will leave a message.     If any biopsies were taken you will be contacted by phone or by letter within the next 1-3 weeks.  Please call us  at [phone] if you have not heard about the biopsies in 3 weeks.    SIGNATURES/CONFIDENTIALITY: You and/or your care partner have signed paperwork which will be entered into your electronic medical record.  These signatures attest to the fact that that the information above on your After Visit Summary has been reviewed and is understood.  Full responsibility of the confidentiality of this discharge information lies with you and/or your care-partner.No polyps or cancer were seen.  You still have mild diverticulosis which are little pockets in the colon.  Given no polyps today you can wait 10 years for a routine repeat colonoscopy.  I appreciate the opportunity to care for you. Deborah CHARLENA Commander, MD, [facility]

## 2024-10-13 NOTE — Op Note (Signed)
 [facility] Patient Name: Deborah Stevens Procedure Date: 10/13/2024 8:08 AM MRN: [id number] Endoscopist: Lupita FORBES Commander , MD, [id number] Age: 58 Referring MD:  Date of Birth: 1965/12/25 Gender: Female Account #: [id number] Procedure:                Colonoscopy Indications:              Surveillance: Personal history of adenomatous                            polyps on last colonoscopy > 5 years ago, Last                            colonoscopy: 2018 Medicines:                Monitored Anesthesia Care Procedure:                Pre-Anesthesia Assessment:                           - Prior to the procedure, a History and Physical                            was performed, and patient medications and                            allergies were reviewed. The patient's tolerance of                            previous anesthesia was also reviewed. The risks                            and benefits of the procedure and the sedation                            options and risks were discussed with the patient.                            All questions were answered, and informed consent                            was obtained. Prior Anticoagulants: The patient has                            taken no anticoagulant or antiplatelet agents. ASA                            Grade Assessment: II - A patient with mild systemic                            disease. After reviewing the risks and benefits,                            the patient was deemed in satisfactory condition to  undergo the procedure.                           After obtaining informed consent, the colonoscope                            was passed under direct vision. Throughout the                            procedure, the patient's blood pressure, pulse, and                            oxygen saturations were monitored continuously. The                            CF [id number] #[id number] was introduced through the  anus                            and advanced to the the cecum, identified by                            appendiceal orifice and ileocecal valve. The                            colonoscopy was performed without difficulty. The                            patient tolerated the procedure well. The quality                            of the bowel preparation was excellent. The                            ileocecal valve, appendiceal orifice, and rectum                            were photographed. The bowel preparation used was                            SUPREP via split dose instruction. Scope In: 8:16:54 AM Scope Out: 8:34:06 AM Scope Withdrawal Time: 0 hours 12 minutes 44 seconds  Total Procedure Duration: 0 hours 17 minutes 12 seconds  Findings:                 The perianal and digital rectal examinations were                            normal.                           A few small-mouthed diverticula were found in the                            sigmoid colon.  The exam was otherwise without abnormality on                            direct and retroflexion views. Complications:            No immediate complications. Estimated Blood Loss:     Estimated blood loss: none. Impression:               - Diverticulosis in the sigmoid colon.                           - The examination was otherwise normal on direct                            and retroflexion views.                           - No specimens collected.                           - Personal history of colonic polyp - diminutive                            adenoma 2018 Recommendation:           - Patient has a contact number available for                            emergencies. The signs and symptoms of potential                            delayed complications were discussed with the                            patient. Return to normal activities tomorrow.                            Written discharge  instructions were provided to the                            patient.                           - Resume previous diet.                           - Continue present medications.                           - Repeat colonoscopy in 10 years. Lupita FORBES Commander, MD 10/13/2024 8:42:31 AM This report has been signed electronically.

## 2024-10-14 ENCOUNTER — Telehealth: Payer: Self-pay

## 2024-10-14 NOTE — Telephone Encounter (Signed)
 Unable reach upon follow up call. Unable to leave VM.

## 2024-10-19 DIAGNOSIS — J301 Allergic rhinitis due to pollen: Secondary | ICD-10-CM | POA: Diagnosis not present

## 2024-10-19 DIAGNOSIS — J3089 Other allergic rhinitis: Secondary | ICD-10-CM | POA: Diagnosis not present

## 2024-10-19 DIAGNOSIS — J3081 Allergic rhinitis due to animal (cat) (dog) hair and dander: Secondary | ICD-10-CM | POA: Diagnosis not present

## 2024-10-25 ENCOUNTER — Encounter: Payer: Self-pay | Admitting: Family Medicine

## 2024-10-25 ENCOUNTER — Ambulatory Visit: Payer: BC Managed Care – PPO | Admitting: Family Medicine

## 2024-10-25 VITALS — BP 132/74 | HR 77 | Temp 97.7°F | Ht 68.0 in | Wt 240.4 lb

## 2024-10-25 DIAGNOSIS — T7491XS Unspecified adult maltreatment, confirmed, sequela: Secondary | ICD-10-CM

## 2024-10-25 DIAGNOSIS — E669 Obesity, unspecified: Secondary | ICD-10-CM

## 2024-10-25 DIAGNOSIS — Z6836 Body mass index (BMI) 36.0-36.9, adult: Secondary | ICD-10-CM

## 2024-10-25 DIAGNOSIS — Z8679 Personal history of other diseases of the circulatory system: Secondary | ICD-10-CM | POA: Diagnosis not present

## 2024-10-25 DIAGNOSIS — Z8249 Family history of ischemic heart disease and other diseases of the circulatory system: Secondary | ICD-10-CM

## 2024-10-25 DIAGNOSIS — Z635 Disruption of family by separation and divorce: Secondary | ICD-10-CM

## 2024-10-25 DIAGNOSIS — E559 Vitamin D deficiency, unspecified: Secondary | ICD-10-CM | POA: Diagnosis not present

## 2024-10-25 DIAGNOSIS — Z0000 Encounter for general adult medical examination without abnormal findings: Secondary | ICD-10-CM | POA: Diagnosis not present

## 2024-10-25 LAB — COMPREHENSIVE METABOLIC PANEL WITH GFR
ALT: 53 U/L — ABNORMAL HIGH (ref 0–35)
AST: 33 U/L (ref 0–37)
Albumin: 4.4 g/dL (ref 3.5–5.2)
Alkaline Phosphatase: 123 U/L — ABNORMAL HIGH (ref 39–117)
BUN: 16 mg/dL (ref 6–23)
CO2: 29 meq/L (ref 19–32)
Calcium: 9.8 mg/dL (ref 8.4–10.5)
Chloride: 104 meq/L (ref 96–112)
Creatinine, Ser: 0.85 mg/dL (ref 0.40–1.20)
GFR: 75.64 mL/min (ref >60.00)
Glucose, Bld: 96 mg/dL (ref 70–99)
Potassium: 4.2 meq/L (ref 3.5–5.1)
Sodium: 140 meq/L (ref 135–145)
Total Bilirubin: 0.4 mg/dL (ref 0.2–1.2)
Total Protein: 7.5 g/dL (ref 6.0–8.3)

## 2024-10-25 LAB — CBC WITH DIFFERENTIAL/PLATELET
Basophils Absolute: 0 K/uL (ref 0.0–0.1)
Basophils Relative: 0.6 % (ref 0.0–3.0)
Eosinophils Absolute: 0.1 K/uL (ref 0.0–0.7)
Eosinophils Relative: 2 % (ref 0.0–5.0)
HCT: 41.3 % (ref 36.0–46.0)
Hemoglobin: 13.6 g/dL (ref 12.0–15.0)
Lymphocytes Relative: 37.6 % (ref 12.0–46.0)
Lymphs Abs: 2.2 K/uL (ref 0.7–4.0)
MCHC: 33 g/dL (ref 30.0–36.0)
MCV: 89.3 fl (ref 78.0–100.0)
Monocytes Absolute: 0.4 K/uL (ref 0.1–1.0)
Monocytes Relative: 6.4 % (ref 3.0–12.0)
Neutro Abs: 3.2 K/uL (ref 1.4–7.7)
Neutrophils Relative %: 53.4 % (ref 43.0–77.0)
Platelets: 204 K/uL (ref 150.0–400.0)
RBC: 4.62 Mil/uL (ref 3.87–5.11)
RDW: 13.9 % (ref 11.5–15.5)
WBC: 6 K/uL (ref 4.0–10.5)

## 2024-10-25 LAB — VITAMIN D 25 HYDROXY (VIT D DEFICIENCY, FRACTURES): VITD: 33.87 ng/mL (ref 30.00–100.00)

## 2024-10-25 LAB — LIPID PANEL
Cholesterol: 181 mg/dL (ref 0–200)
HDL: 65.1 mg/dL (ref >39.00)
LDL Cholesterol: 94 mg/dL (ref 0–99)
NonHDL: 115.67
Total CHOL/HDL Ratio: 3
Triglycerides: 108 mg/dL (ref 0.0–149.0)
VLDL: 21.6 mg/dL (ref 0.0–40.0)

## 2024-10-25 LAB — HEMOGLOBIN A1C: Hgb A1c MFr Bld: 6.3 % (ref 4.6–6.5)

## 2024-10-25 LAB — TSH: TSH: 1.06 u[IU]/mL (ref 0.35–5.50)
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
## 2024-10-26 ENCOUNTER — Encounter: Payer: Self-pay | Admitting: Family Medicine

## 2024-10-26 DIAGNOSIS — F4321 Adjustment disorder with depressed mood: Secondary | ICD-10-CM | POA: Diagnosis not present

## 2024-10-27 DIAGNOSIS — J3081 Allergic rhinitis due to animal (cat) (dog) hair and dander: Secondary | ICD-10-CM | POA: Diagnosis not present

## 2024-10-27 DIAGNOSIS — J3089 Other allergic rhinitis: Secondary | ICD-10-CM | POA: Diagnosis not present

## 2024-10-27 DIAGNOSIS — J301 Allergic rhinitis due to pollen: Secondary | ICD-10-CM | POA: Diagnosis not present

## 2024-11-01 ENCOUNTER — Ambulatory Visit: Admitting: Family Medicine

## 2024-11-01 ENCOUNTER — Other Ambulatory Visit

## 2024-11-01 VITALS — BP 134/86 | HR 75 | Temp 97.7°F | Ht 68.0 in | Wt 239.4 lb

## 2024-11-01 DIAGNOSIS — E669 Obesity, unspecified: Secondary | ICD-10-CM | POA: Diagnosis not present

## 2024-11-01 DIAGNOSIS — F4322 Adjustment disorder with anxiety: Secondary | ICD-10-CM

## 2024-11-01 DIAGNOSIS — R7989 Other specified abnormal findings of blood chemistry: Secondary | ICD-10-CM

## 2024-11-01 DIAGNOSIS — Z006 Encounter for examination for normal comparison and control in clinical research program: Secondary | ICD-10-CM

## 2024-11-01 DIAGNOSIS — R7303 Prediabetes: Secondary | ICD-10-CM | POA: Diagnosis not present

## 2024-11-01 DIAGNOSIS — F410 Panic disorder [episodic paroxysmal anxiety] without agoraphobia: Secondary | ICD-10-CM | POA: Diagnosis not present

## 2024-11-01 MED ORDER — ALPRAZOLAM 0.5 MG PO TABS: 0.5000 mg | ORAL_TABLET | Freq: Every day | ORAL | 0 refills | Status: AC | PRN

## 2024-11-01 MED ORDER — SERTRALINE HCL 25 MG PO TABS: ORAL_TABLET | ORAL | 1 refills | Status: DC

## 2024-11-01 MED ORDER — HYDROXYZINE PAMOATE 25 MG PO CAPS: 25.0000 mg | ORAL_CAPSULE | Freq: Every day | ORAL | 2 refills | Status: DC
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
## 2024-11-01 NOTE — Patient Instructions (Signed)
 Please return in 6 weeks to recheck mood  If you have any questions or concerns, please don't hesitate to send me a message via MyChart or call the office at [phone]. Thank you for visiting with us  today! It's our pleasure caring for you.    VISIT SUMMARY: Today, we discussed your recent increase in anxiety and panic attacks, which have been exacerbated by recent stressors such as your separation and the upcoming holidays. We also addressed your significant sleep disturbances and the impact of past trauma on your current mental health.  YOUR PLAN: -POST-TRAUMATIC STRESS DISORDER WITH PANIC DISORDER, DEPRESSION, AND INSOMNIA: This condition involves experiencing severe anxiety, panic attacks, depression, and difficulty sleeping, often triggered by traumatic events. To help manage your symptoms, we will restart Zoloft to regulate your brain's neurotransmitters, prescribe Xanax for acute panic attacks, and prescribe hydroxyzine to help you sleep and manage anxiety at night. Continue with your therapy sessions and use coping mechanisms like journaling and deep breathing exercises.  INSTRUCTIONS: Please follow up with your therapist regularly and take your medications as prescribed. If you experience any side effects or if your symptoms worsen, contact our office immediately.                      Contains text generated by [employer]bridge.                                 Contains text generated by [employer]bridge.

## 2024-11-02 ENCOUNTER — Telehealth: Payer: Self-pay | Admitting: Gastroenterology

## 2024-11-02 ENCOUNTER — Encounter: Payer: Self-pay | Admitting: Internal Medicine

## 2024-11-02 DIAGNOSIS — R194 Change in bowel habit: Secondary | ICD-10-CM

## 2024-11-02 NOTE — Addendum Note (Signed)
 Addended by: SUELLEN PEERS on: 11/02/2024 10:56 AM   Modules accepted: Orders

## 2024-11-02 NOTE — Telephone Encounter (Signed)
 [facility] Disclosure document  Called patient to inform that it has come to our attention that on the day of your procedure(colonoscopy on 10/13/24), we have found out that the instrument used in your procedure did not complete all the quality assurance steps we take to clean our equipment.  We have verified that the instruments did undergo cleaning with effective chemicals that kill bacteremia, viruses and other infectious agents.   After a thorough reviewing the process with our providers, and infection prevention leaders, we came to the conclusion that there is very minimal risk of infectious transmission. In the meantime, we have improved our process so that no part of the checklist for quality assurance can be missed. We felt it was important to disclose this to you in our commitment to our patient doctor relationship. Please let us  know if you have any other questions.  Patient was appreciative of the phone call and disclosure.  She reported that she has been having florescent green stool , 4-6 bowel movements per day since the colonoscopy.  No abdominal pain, fever, nausea or vomiting. She saw her primary care for PTSD, panic attack and anxiety.  No mention of diarrhea.  She had elevation in LFT.  I informed patient that I will send message to Dr. Avram regarding the diarrhea, office will contact her with follow-up appointment and stool testing for GI path panel  The patient was provided an opportunity to ask questions and all were answered. The patient agreed with the plan and demonstrated an understanding of the instructions.  LOIS Wilkie Mcgee , MD [phone]

## 2024-11-02 NOTE — Telephone Encounter (Signed)
 Lab order placed for GI profile panel. Follow-up appointment scheduled for 12/02/24 @ 8:50 am with Dr Avram. Patient has been informed.

## 2024-11-02 NOTE — Telephone Encounter (Signed)
 Noted

## 2024-11-02 NOTE — Telephone Encounter (Signed)
 Ro looks like you entered the order for stool testing. Did you need to speak with her?

## 2024-11-04 ENCOUNTER — Other Ambulatory Visit

## 2024-11-04 DIAGNOSIS — R194 Change in bowel habit: Secondary | ICD-10-CM | POA: Diagnosis not present

## 2024-11-06 LAB — GI PROFILE, STOOL, PCR

## 2024-11-09 ENCOUNTER — Ambulatory Visit: Payer: Self-pay | Admitting: Gastroenterology

## 2024-11-10 DIAGNOSIS — J3081 Allergic rhinitis due to animal (cat) (dog) hair and dander: Secondary | ICD-10-CM | POA: Diagnosis not present

## 2024-11-10 DIAGNOSIS — J3089 Other allergic rhinitis: Secondary | ICD-10-CM | POA: Diagnosis not present

## 2024-11-10 DIAGNOSIS — J301 Allergic rhinitis due to pollen: Secondary | ICD-10-CM | POA: Diagnosis not present

## 2024-11-15 LAB — GENECONNECT MOLECULAR SCREEN: Genetic Analysis Overall Interpretation: NEGATIVE

## 2024-11-23 ENCOUNTER — Other Ambulatory Visit: Payer: Self-pay | Admitting: Family Medicine

## 2024-11-25 ENCOUNTER — Other Ambulatory Visit: Payer: Self-pay | Admitting: Family Medicine

## 2024-11-25 DIAGNOSIS — J3089 Other allergic rhinitis: Secondary | ICD-10-CM | POA: Diagnosis not present

## 2024-11-25 DIAGNOSIS — J301 Allergic rhinitis due to pollen: Secondary | ICD-10-CM | POA: Diagnosis not present

## 2024-11-25 DIAGNOSIS — J3081 Allergic rhinitis due to animal (cat) (dog) hair and dander: Secondary | ICD-10-CM | POA: Diagnosis not present

## 2024-11-25 MED ORDER — SERTRALINE HCL 50 MG PO TABS: 50.0000 mg | ORAL_TABLET | Freq: Every day | ORAL | 3 refills | Status: AC

## 2024-12-02 ENCOUNTER — Ambulatory Visit: Admitting: Internal Medicine

## 2024-12-13 ENCOUNTER — Telehealth: Admitting: Family Medicine

## 2024-12-13 ENCOUNTER — Encounter: Payer: Self-pay | Admitting: Family Medicine

## 2024-12-13 ENCOUNTER — Telehealth: Payer: Self-pay

## 2024-12-13 VITALS — Ht 68.0 in | Wt 220.0 lb

## 2024-12-13 DIAGNOSIS — F4322 Adjustment disorder with anxiety: Secondary | ICD-10-CM | POA: Diagnosis not present

## 2024-12-13 DIAGNOSIS — F410 Panic disorder [episodic paroxysmal anxiety] without agoraphobia: Secondary | ICD-10-CM | POA: Diagnosis not present

## 2024-12-13 DIAGNOSIS — Z0279 Encounter for issue of other medical certificate: Secondary | ICD-10-CM

## 2024-12-13 NOTE — Telephone Encounter (Signed)
 Called and spoke to patient and informed her that her FMLA papers were completed and ready for pick up. Patient verbalized understanding and expressed that she would be here five minutes to five today to pick her papers up. Patient should review forms before leaving to ensure everything looks good to avoid any delays with her employer.
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
## 2024-12-14 ENCOUNTER — Ambulatory Visit: Admitting: Family Medicine

## 2025-10-31 ENCOUNTER — Encounter: Admitting: Family Medicine
